# Patient Record
Sex: Female | Born: 1965
Health system: Southern US, Community
[De-identification: ages and names within clinical notes are randomized; demographics above are authoritative.]

## PROBLEM LIST (undated history)

## (undated) DIAGNOSIS — T7840XA Allergy, unspecified, initial encounter: Secondary | ICD-10-CM

## (undated) DIAGNOSIS — H269 Unspecified cataract: Secondary | ICD-10-CM

## (undated) DIAGNOSIS — I10 Essential (primary) hypertension: Secondary | ICD-10-CM

## (undated) DIAGNOSIS — F329 Major depressive disorder, single episode, unspecified: Secondary | ICD-10-CM

## (undated) DIAGNOSIS — F419 Anxiety disorder, unspecified: Secondary | ICD-10-CM

## (undated) DIAGNOSIS — E785 Hyperlipidemia, unspecified: Secondary | ICD-10-CM

## (undated) DIAGNOSIS — F32A Depression, unspecified: Secondary | ICD-10-CM

## (undated) HISTORY — DX: Allergy, unspecified, initial encounter: T78.40XA

## (undated) HISTORY — PX: COLONOSCOPY: SHX174

## (undated) HISTORY — DX: Hyperlipidemia, unspecified: E78.5

## (undated) HISTORY — DX: Essential (primary) hypertension: I10

## (undated) HISTORY — DX: Unspecified cataract: H26.9

## (undated) HISTORY — DX: Anxiety disorder, unspecified: F41.9

## (undated) HISTORY — PX: NASAL SEPTUM SURGERY: SHX37

## (undated) HISTORY — PX: BUNIONECTOMY: SHX129

## (undated) HISTORY — PX: TONSILLECTOMY: SUR1361

## (undated) HISTORY — DX: Depression, unspecified: F32.A

## (undated) HISTORY — PX: BLADDER SUSPENSION: SHX72

## (undated) HISTORY — PX: OTHER SURGICAL HISTORY: SHX169

---
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
## 2005-08-03 ENCOUNTER — Other Ambulatory Visit: Admission: RE | Admit: 2005-08-03 | Discharge: 2005-08-03 | Payer: Self-pay | Admitting: Family Medicine

## 2006-11-03 ENCOUNTER — Other Ambulatory Visit: Admission: RE | Admit: 2006-11-03 | Discharge: 2006-11-03 | Payer: Self-pay | Admitting: Family Medicine

## 2007-08-25 ENCOUNTER — Emergency Department (HOSPITAL_COMMUNITY): Admission: EM | Admit: 2007-08-25 | Discharge: 2007-08-25 | Payer: Self-pay | Admitting: Emergency Medicine

## 2007-12-14 ENCOUNTER — Other Ambulatory Visit: Admission: RE | Admit: 2007-12-14 | Discharge: 2007-12-14 | Payer: Self-pay | Admitting: Family Medicine

## 2008-01-02 ENCOUNTER — Ambulatory Visit (HOSPITAL_COMMUNITY): Admission: RE | Admit: 2008-01-02 | Discharge: 2008-01-02 | Payer: Self-pay | Admitting: Family Medicine

## 2008-12-16 ENCOUNTER — Other Ambulatory Visit: Admission: RE | Admit: 2008-12-16 | Discharge: 2008-12-16 | Payer: Self-pay | Admitting: Family Medicine

## 2009-07-13 ENCOUNTER — Ambulatory Visit (HOSPITAL_COMMUNITY): Admission: RE | Admit: 2009-07-13 | Discharge: 2009-07-13 | Payer: Self-pay | Admitting: Family Medicine

## 2010-03-05 ENCOUNTER — Other Ambulatory Visit
Admission: RE | Admit: 2010-03-05 | Discharge: 2010-03-05 | Payer: Self-pay | Source: Home / Self Care | Admitting: Family Medicine

## 2010-06-01 LAB — BASIC METABOLIC PANEL
BUN: 16 mg/dL (ref 6–23)
Calcium: 9.6 mg/dL (ref 8.4–10.5)
Chloride: 104 mEq/L (ref 96–112)
Glucose, Bld: 88 mg/dL (ref 70–99)
Potassium: 4.3 mEq/L (ref 3.5–5.1)

## 2010-06-01 LAB — CBC
MCV: 93.7 fL (ref 78.0–100.0)
RBC: 4.43 MIL/uL (ref 3.87–5.11)
RDW: 12.8 % (ref 11.5–15.5)
WBC: 6.6 10*3/uL (ref 4.0–10.5)

## 2010-06-01 LAB — APTT: aPTT: 23 seconds — ABNORMAL LOW (ref 24–37)

## 2010-06-03 ENCOUNTER — Ambulatory Visit (HOSPITAL_BASED_OUTPATIENT_CLINIC_OR_DEPARTMENT_OTHER)
Admission: RE | Admit: 2010-06-03 | Discharge: 2010-06-03 | Disposition: A | Discharge status: Home / Self Care | Payer: Commercial Managed Care - PPO | Source: Ambulatory Visit | Attending: Urology | Admitting: Urology

## 2010-06-03 DIAGNOSIS — I10 Essential (primary) hypertension: Secondary | ICD-10-CM | POA: Insufficient documentation

## 2010-06-03 DIAGNOSIS — Z01810 Encounter for preprocedural cardiovascular examination: Secondary | ICD-10-CM | POA: Insufficient documentation

## 2010-06-03 DIAGNOSIS — N393 Stress incontinence (female) (male): Secondary | ICD-10-CM | POA: Insufficient documentation

## 2010-06-03 DIAGNOSIS — Z01812 Encounter for preprocedural laboratory examination: Secondary | ICD-10-CM | POA: Insufficient documentation

## 2010-06-03 LAB — POCT PREGNANCY, URINE: Preg Test, Ur: NEGATIVE

## 2010-07-12 NOTE — Op Note (Signed)
  NAME:  Terry English, Terry English NO.:  [id number]  MEDICAL RECORD NO.:  [id number]           PATIENT TYPE:  LOCATION:                                 FACILITY:  PHYSICIAN:  Martina Sinner, MD      DATE OF BIRTH:  DATE OF PROCEDURE:  06/02/2010 DATE OF DISCHARGE:                              OPERATIVE REPORT   PREOPERATIVE DIAGNOSIS:  Stress incontinence.  POSTOPERATIVE DIAGNOSIS:  Stress incontinence.  SURGERY:  Sling, cystourethropexy [facility]) plus cystoscopy.  INDICATIONS FOR PROCEDURE:  The patient has stress urinary incontinence and consented to the above procedure.  PROCEDURE IN DETAIL:  The patient was prepped and draped in the usual fashion.  Extra care was taken with leg positioning to minimize the risk of compartment syndrome, neuropathy, and DVT.  Preoperative antibiotics were given.  Two 1-cm incisions were made one fingerbreadth above the symphysis pubis 1.5 cm lateral to the midline.  A 2-cm suburethral incision was made after instilling 5 cc of a lidocaine epinephrine mixture.  It was of appropriate depth.  With the bladder empty, I passed a SPARC needle on top of and along the back of the symphysis pubis, staying on the periosteum and staying lateral.  I did my box technique, turning it in onto the pulp of my index finger bilaterally.  I cystoscoped the patient.  There was no injury to the bladder or urethra.  There was excellent blue jets bilaterally.  There was no movement of the bladder with movement of the trocar.  With the bladder empty, I attached a SPARC sling and brought it up to the retropubic space.  I tensioned it over the fat part of a moderate- sized Kelly clamp.  I cut below the blue dots, irrigated the sheath and removed the sheath.  I was very pleased with the tension and position of the sling.  There was no spring-back effect.  After irrigating all incisions, I closed the anterior vaginal wall incision with running 2-0  Vicryl on a CT-1 needle followed by my usual two interrupted sutures.  I cut the sling below the skin in the suprapubic area and closed with 4-0 Vicryl and Dermabond.  Foley catheter draining well at the of the case.  Blood loss was less than 50 cc.  The case went very well and hopefully she will reach her treatment goal.          ______________________________ Martina Sinner, MD     SAM/MEDQ  D:  06/03/2010  T:  06/03/2010  Job:  [id number]  Electronically Signed by Alfredo Martinez MD on 07/12/2010 01:15:24 PM

## 2013-03-04 ENCOUNTER — Other Ambulatory Visit: Payer: Self-pay | Admitting: Family Medicine

## 2013-03-04 ENCOUNTER — Other Ambulatory Visit (HOSPITAL_COMMUNITY)
Admission: RE | Admit: 2013-03-04 | Discharge: 2013-03-04 | Disposition: A | Discharge status: Home / Self Care | Payer: 59 | Source: Ambulatory Visit | Attending: Family Medicine | Admitting: Family Medicine

## 2013-03-04 DIAGNOSIS — Z124 Encounter for screening for malignant neoplasm of cervix: Secondary | ICD-10-CM | POA: Insufficient documentation

## 2013-03-04 DIAGNOSIS — Z1151 Encounter for screening for human papillomavirus (HPV): Secondary | ICD-10-CM | POA: Insufficient documentation

## 2013-03-05 ENCOUNTER — Other Ambulatory Visit: Payer: Self-pay | Admitting: Family Medicine

## 2013-03-05 DIAGNOSIS — N6325 Unspecified lump in the left breast, overlapping quadrants: Secondary | ICD-10-CM

## 2013-03-05 DIAGNOSIS — N6320 Unspecified lump in the left breast, unspecified quadrant: Principal | ICD-10-CM

## 2013-03-11 ENCOUNTER — Ambulatory Visit
Admission: RE | Admit: 2013-03-11 | Discharge: 2013-03-11 | Disposition: A | Discharge status: Home / Self Care | Payer: Commercial Managed Care - PPO | Source: Ambulatory Visit | Attending: Family Medicine | Admitting: Family Medicine

## 2013-03-11 DIAGNOSIS — N6320 Unspecified lump in the left breast, unspecified quadrant: Principal | ICD-10-CM

## 2013-03-11 DIAGNOSIS — N6325 Unspecified lump in the left breast, overlapping quadrants: Secondary | ICD-10-CM

## 2013-12-03 ENCOUNTER — Other Ambulatory Visit: Payer: Self-pay | Admitting: Dermatology

## 2014-08-21 ENCOUNTER — Other Ambulatory Visit (HOSPITAL_COMMUNITY): Payer: Self-pay | Admitting: Family Medicine

## 2014-08-21 DIAGNOSIS — Z1231 Encounter for screening mammogram for malignant neoplasm of breast: Secondary | ICD-10-CM

## 2014-08-27 ENCOUNTER — Ambulatory Visit (HOSPITAL_COMMUNITY)
Admission: RE | Admit: 2014-08-27 | Discharge: 2014-08-27 | Disposition: A | Discharge status: Home / Self Care | Payer: 59 | Source: Ambulatory Visit | Attending: Family Medicine | Admitting: Family Medicine

## 2014-08-27 DIAGNOSIS — Z1231 Encounter for screening mammogram for malignant neoplasm of breast: Secondary | ICD-10-CM | POA: Insufficient documentation

## 2015-03-04 MED FILL — BUTALBITAL/APAP/CAFFEINE TB: 50-325-40 | 2 days supply | Qty: 12 | Fill #3

## 2015-03-19 DIAGNOSIS — Z1212 Encounter for screening for malignant neoplasm of rectum: Secondary | ICD-10-CM | POA: Diagnosis not present

## 2015-03-19 DIAGNOSIS — Z6834 Body mass index (BMI) 34.0-34.9, adult: Secondary | ICD-10-CM | POA: Diagnosis not present

## 2015-03-19 DIAGNOSIS — N920 Excessive and frequent menstruation with regular cycle: Secondary | ICD-10-CM | POA: Diagnosis not present

## 2015-03-19 DIAGNOSIS — Z01419 Encounter for gynecological examination (general) (routine) without abnormal findings: Secondary | ICD-10-CM | POA: Diagnosis not present

## 2015-03-30 DIAGNOSIS — H5213 Myopia, bilateral: Secondary | ICD-10-CM | POA: Diagnosis not present

## 2015-03-30 DIAGNOSIS — H52221 Regular astigmatism, right eye: Secondary | ICD-10-CM | POA: Diagnosis not present

## 2015-03-31 DIAGNOSIS — N920 Excessive and frequent menstruation with regular cycle: Secondary | ICD-10-CM | POA: Diagnosis not present

## 2015-03-31 DIAGNOSIS — N924 Excessive bleeding in the premenopausal period: Secondary | ICD-10-CM | POA: Diagnosis not present

## 2015-04-09 MED FILL — ROSUVASTATIN CALCIUM 10 MG: 10 | 90 days supply | Qty: 90 | Fill #2

## 2015-05-15 MED FILL — LARIN 21 1-20 TABLET: 1-20 | 84 days supply | Qty: 84 | Fill #0

## 2015-05-15 MED FILL — LISINOPRIL-HCTZ 20-12.5 MG: 20-12.5 | 90 days supply | Qty: 90 | Fill #0

## 2015-05-18 DIAGNOSIS — F329 Major depressive disorder, single episode, unspecified: Secondary | ICD-10-CM | POA: Diagnosis not present

## 2015-05-18 DIAGNOSIS — I10 Essential (primary) hypertension: Secondary | ICD-10-CM | POA: Diagnosis not present

## 2015-05-18 DIAGNOSIS — R739 Hyperglycemia, unspecified: Secondary | ICD-10-CM | POA: Diagnosis not present

## 2015-05-18 DIAGNOSIS — Z0000 Encounter for general adult medical examination without abnormal findings: Secondary | ICD-10-CM | POA: Diagnosis not present

## 2015-05-18 DIAGNOSIS — Z23 Encounter for immunization: Secondary | ICD-10-CM | POA: Diagnosis not present

## 2015-05-18 DIAGNOSIS — E785 Hyperlipidemia, unspecified: Secondary | ICD-10-CM | POA: Diagnosis not present

## 2015-05-18 MED FILL — FLUoxetine HCL 20 MG CAPS: 20 | 90 days supply | Qty: 90 | Fill #0

## 2015-05-18 MED FILL — BUTALBITAL/APAP/CAFFEINE TB: 50-325-40 | 7 days supply | Qty: 20 | Fill #0

## 2015-07-13 MED FILL — ROSUVASTATIN CALCIUM 10 MG: 10 | 90 days supply | Qty: 90 | Fill #0

## 2015-08-03 DIAGNOSIS — N924 Excessive bleeding in the premenopausal period: Secondary | ICD-10-CM | POA: Diagnosis not present

## 2015-08-03 DIAGNOSIS — D259 Leiomyoma of uterus, unspecified: Secondary | ICD-10-CM | POA: Diagnosis not present

## 2015-08-12 MED FILL — LISINOPRIL-HCTZ 20-12.5 MG: 20-12.5 | 90 days supply | Qty: 90 | Fill #1

## 2015-08-12 MED FILL — LARIN 21 1-20 TABLET: 1-20 | 84 days supply | Qty: 84 | Fill #1

## 2015-10-13 MED FILL — ROSUVASTATIN CALCIUM 10 MG: 10 | 90 days supply | Qty: 90 | Fill #1

## 2015-11-11 MED FILL — BUTALBITAL/APAP/CAFFEINE TB: 50-325-40 | 7 days supply | Qty: 20 | Fill #1

## 2015-11-18 MED FILL — LISINOPRIL-HCTZ 20-12.5 MG: 20-12.5 | 90 days supply | Qty: 90 | Fill #2

## 2015-11-18 MED FILL — LARIN 21 1-20 TABLET: 1-20 | 84 days supply | Qty: 84 | Fill #2

## 2015-12-24 MED FILL — FLUoxetine HCL 20 MG CAPS: 20 | 90 days supply | Qty: 90 | Fill #1

## 2016-01-18 MED FILL — ROSUVASTATIN CALCIUM 10 MG: 10 | 90 days supply | Qty: 90 | Fill #2

## 2016-02-05 MED FILL — BUTALBITAL/APAP/CAFFEINE TB: 50-325-40 | 7 days supply | Qty: 20 | Fill #2

## 2016-02-15 MED FILL — LISINOPRIL-HCTZ 20-12.5 MG: 20-12.5 | 90 days supply | Qty: 90 | Fill #3

## 2016-02-15 MED FILL — LARIN 21 1-20 TABLET: 1-20 | 84 days supply | Qty: 84 | Fill #3

## 2016-03-11 DIAGNOSIS — Z23 Encounter for immunization: Secondary | ICD-10-CM | POA: Diagnosis not present

## 2016-03-23 DIAGNOSIS — H5213 Myopia, bilateral: Secondary | ICD-10-CM | POA: Diagnosis not present

## 2016-03-23 DIAGNOSIS — H52221 Regular astigmatism, right eye: Secondary | ICD-10-CM | POA: Diagnosis not present

## 2016-04-18 MED FILL — ROSUVASTATIN CALCIUM 10 MG: 10 | 90 days supply | Qty: 90 | Fill #3

## 2016-05-23 MED FILL — LISINOPRIL-HCTZ 20-12.5 MG: 20-12.5 | 90 days supply | Qty: 90 | Fill #0

## 2016-05-23 MED FILL — LARIN 21 1-20 TABLET: 1-20 | 84 days supply | Qty: 84 | Fill #0

## 2016-06-01 DIAGNOSIS — I10 Essential (primary) hypertension: Secondary | ICD-10-CM | POA: Diagnosis not present

## 2016-06-01 DIAGNOSIS — Z0000 Encounter for general adult medical examination without abnormal findings: Secondary | ICD-10-CM | POA: Diagnosis not present

## 2016-06-01 DIAGNOSIS — E785 Hyperlipidemia, unspecified: Secondary | ICD-10-CM | POA: Diagnosis not present

## 2016-06-01 DIAGNOSIS — R7303 Prediabetes: Secondary | ICD-10-CM | POA: Diagnosis not present

## 2016-06-01 DIAGNOSIS — F330 Major depressive disorder, recurrent, mild: Secondary | ICD-10-CM | POA: Diagnosis not present

## 2016-06-01 MED FILL — ESCITALOPRAM 10 MG TABLET: 10 | 90 days supply | Qty: 90 | Fill #0

## 2016-06-01 MED FILL — BUTALBITAL/APAP/CAFFEINE TB: 50-325-40 | 3 days supply | Qty: 20 | Fill #0

## 2016-07-18 MED FILL — ROSUVASTATIN CALCIUM 10 MG: 10 | 90 days supply | Qty: 90 | Fill #0

## 2016-08-23 MED FILL — LISINOPRIL-HCTZ 20-12.5 MG: 20-12.5 | 90 days supply | Qty: 90 | Fill #1

## 2016-08-24 MED FILL — LARIN 21 1-20 TABLET: 1-20 | 84 days supply | Qty: 84 | Fill #1

## 2016-09-02 MED FILL — ESCITALOPRAM 10 MG TABLET: 10 | 90 days supply | Qty: 90 | Fill #1

## 2016-10-14 MED FILL — ROSUVASTATIN CALCIUM 10 MG: 10 | 90 days supply | Qty: 90 | Fill #1

## 2016-11-23 MED FILL — LISINOPRIL-HCTZ 20-12.5 MG: 20-12.5 | 90 days supply | Qty: 90 | Fill #2

## 2016-11-23 MED FILL — LARIN 21 1-20 TABLET: 1-20 | 84 days supply | Qty: 84 | Fill #2

## 2016-11-23 MED FILL — ESCITALOPRAM 10 MG TABLET: 10 | 90 days supply | Qty: 90 | Fill #2

## 2016-12-28 DIAGNOSIS — E559 Vitamin D deficiency, unspecified: Secondary | ICD-10-CM | POA: Diagnosis not present

## 2016-12-28 DIAGNOSIS — R5382 Chronic fatigue, unspecified: Secondary | ICD-10-CM | POA: Diagnosis not present

## 2016-12-28 DIAGNOSIS — Z23 Encounter for immunization: Secondary | ICD-10-CM | POA: Diagnosis not present

## 2016-12-28 DIAGNOSIS — E611 Iron deficiency: Secondary | ICD-10-CM | POA: Diagnosis not present

## 2016-12-28 DIAGNOSIS — E538 Deficiency of other specified B group vitamins: Secondary | ICD-10-CM | POA: Diagnosis not present

## 2016-12-28 DIAGNOSIS — R05 Cough: Secondary | ICD-10-CM | POA: Diagnosis not present

## 2016-12-28 MED FILL — BENZONATATE 200 MG CAPSULE: 200 | 10 days supply | Qty: 30 | Fill #0

## 2016-12-28 MED FILL — AZITHROMYCIN 250 MG TABLET: 250 | 5 days supply | Qty: 6 | Fill #0

## 2017-01-03 ENCOUNTER — Other Ambulatory Visit: Payer: Self-pay | Admitting: Family Medicine

## 2017-01-03 DIAGNOSIS — R7989 Other specified abnormal findings of blood chemistry: Secondary | ICD-10-CM

## 2017-01-03 DIAGNOSIS — R945 Abnormal results of liver function studies: Principal | ICD-10-CM

## 2017-01-09 ENCOUNTER — Other Ambulatory Visit: Payer: 59

## 2017-01-09 MED FILL — ROSUVASTATIN CALCIUM 10 MG: 10 | 90 days supply | Qty: 90 | Fill #2

## 2017-01-13 ENCOUNTER — Ambulatory Visit
Admission: RE | Admit: 2017-01-13 | Discharge: 2017-01-13 | Disposition: A | Discharge status: Home / Self Care | Payer: 59 | Source: Ambulatory Visit | Attending: Family Medicine | Admitting: Family Medicine

## 2017-01-13 DIAGNOSIS — R7989 Other specified abnormal findings of blood chemistry: Secondary | ICD-10-CM

## 2017-01-13 DIAGNOSIS — R945 Abnormal results of liver function studies: Principal | ICD-10-CM

## 2017-01-25 ENCOUNTER — Other Ambulatory Visit: Payer: Self-pay | Admitting: Obstetrics and Gynecology

## 2017-01-25 DIAGNOSIS — Z1231 Encounter for screening mammogram for malignant neoplasm of breast: Secondary | ICD-10-CM

## 2017-02-02 DIAGNOSIS — Z01419 Encounter for gynecological examination (general) (routine) without abnormal findings: Secondary | ICD-10-CM | POA: Diagnosis not present

## 2017-02-02 DIAGNOSIS — Z1212 Encounter for screening for malignant neoplasm of rectum: Secondary | ICD-10-CM | POA: Diagnosis not present

## 2017-02-02 DIAGNOSIS — Z6834 Body mass index (BMI) 34.0-34.9, adult: Secondary | ICD-10-CM | POA: Diagnosis not present

## 2017-02-17 MED FILL — LISINOPRIL-HCTZ 20-12.5 MG: 20-12.5 | 90 days supply | Qty: 90 | Fill #3

## 2017-02-17 MED FILL — BUTALB-ACETAMIN-CAFF 50-325: 50-325-40 | 3 days supply | Qty: 20 | Fill #1

## 2017-02-17 MED FILL — ESCITALOPRAM 10 MG TABLET: 10 | 90 days supply | Qty: 90 | Fill #3

## 2017-02-17 MED FILL — LARIN 21 1-20 TABLET: 1-20 | 84 days supply | Qty: 84 | Fill #3

## 2017-02-22 ENCOUNTER — Ambulatory Visit
Admission: RE | Admit: 2017-02-22 | Discharge: 2017-02-22 | Disposition: A | Discharge status: Home / Self Care | Payer: 59 | Source: Ambulatory Visit | Attending: Obstetrics and Gynecology | Admitting: Obstetrics and Gynecology

## 2017-02-22 DIAGNOSIS — Z1231 Encounter for screening mammogram for malignant neoplasm of breast: Secondary | ICD-10-CM | POA: Diagnosis not present

## 2017-02-23 ENCOUNTER — Other Ambulatory Visit: Payer: Self-pay | Admitting: Obstetrics and Gynecology

## 2017-02-23 DIAGNOSIS — R928 Other abnormal and inconclusive findings on diagnostic imaging of breast: Secondary | ICD-10-CM

## 2017-03-01 ENCOUNTER — Ambulatory Visit
Admission: RE | Admit: 2017-03-01 | Discharge: 2017-03-01 | Disposition: A | Discharge status: Home / Self Care | Payer: 59 | Source: Ambulatory Visit | Attending: Obstetrics and Gynecology | Admitting: Obstetrics and Gynecology

## 2017-03-01 ENCOUNTER — Ambulatory Visit: Admission: RE | Admit: 2017-03-01 | Payer: 59 | Source: Ambulatory Visit

## 2017-03-01 DIAGNOSIS — R928 Other abnormal and inconclusive findings on diagnostic imaging of breast: Secondary | ICD-10-CM

## 2017-03-01 DIAGNOSIS — N6011 Diffuse cystic mastopathy of right breast: Secondary | ICD-10-CM | POA: Diagnosis not present

## 2017-03-01 DIAGNOSIS — R922 Inconclusive mammogram: Secondary | ICD-10-CM | POA: Diagnosis not present

## 2017-03-31 DIAGNOSIS — H524 Presbyopia: Secondary | ICD-10-CM | POA: Diagnosis not present

## 2017-03-31 DIAGNOSIS — H52221 Regular astigmatism, right eye: Secondary | ICD-10-CM | POA: Diagnosis not present

## 2017-03-31 DIAGNOSIS — H5213 Myopia, bilateral: Secondary | ICD-10-CM | POA: Diagnosis not present

## 2017-05-23 MED FILL — ROSUVASTATIN CALCIUM 10 MG: 10 | 90 days supply | Qty: 90 | Fill #3

## 2017-05-23 MED FILL — LARIN 21 1-20 TABLET: 1-20 | 84 days supply | Qty: 84 | Fill #0

## 2017-05-23 MED FILL — BUTALB-ACETAMIN-CAFF 50-325: 50-325-40 | 3 days supply | Qty: 20 | Fill #2

## 2017-06-02 DIAGNOSIS — Z0000 Encounter for general adult medical examination without abnormal findings: Secondary | ICD-10-CM | POA: Diagnosis not present

## 2017-06-02 DIAGNOSIS — I10 Essential (primary) hypertension: Secondary | ICD-10-CM | POA: Diagnosis not present

## 2017-06-02 DIAGNOSIS — E785 Hyperlipidemia, unspecified: Secondary | ICD-10-CM | POA: Diagnosis not present

## 2017-06-02 DIAGNOSIS — F330 Major depressive disorder, recurrent, mild: Secondary | ICD-10-CM | POA: Diagnosis not present

## 2017-06-02 DIAGNOSIS — E559 Vitamin D deficiency, unspecified: Secondary | ICD-10-CM | POA: Diagnosis not present

## 2017-06-02 MED FILL — ONDANSETRON ODT 8 MG TABLET: 8 | 5 days supply | Qty: 15 | Fill #0

## 2017-06-02 MED FILL — ESCITALOPRAM 10 MG TABLET: 10 | 90 days supply | Qty: 90 | Fill #0

## 2017-06-02 MED FILL — LISINOPRIL-HCTZ 20-12.5 MG: 20-12.5 | 90 days supply | Qty: 90 | Fill #0

## 2017-06-02 MED FILL — VIT D2 1.25 MG (50,000 UNIT: 1.25 MG | 56 days supply | Qty: 8 | Fill #0

## 2017-08-11 MED FILL — BUTALB-ACETAMIN-CAFF 50-325: 50-325-40 | 30 days supply | Qty: 20 | Fill #0

## 2017-08-22 MED FILL — ROSUVASTATIN CALCIUM 10 MG: 10 | 90 days supply | Qty: 90 | Fill #0

## 2017-08-22 MED FILL — LISINOPRIL-HCTZ 20-12.5 MG: 20-12.5 | 90 days supply | Qty: 90 | Fill #1

## 2017-08-24 MED FILL — LARIN 21 1-20 TABLET: 1-20 | 84 days supply | Qty: 84 | Fill #1

## 2017-09-07 MED FILL — ESCITALOPRAM 10 MG TABLET: 10 | 90 days supply | Qty: 90 | Fill #1

## 2017-10-23 MED FILL — BUTALB-ACETAMIN-CAFF 50-325: 50-325-40 | 30 days supply | Qty: 20 | Fill #1

## 2017-11-20 MED FILL — ROSUVASTATIN CALCIUM 10 MG: 10 | 90 days supply | Qty: 90 | Fill #1

## 2017-11-20 MED FILL — LARIN 21 1-20 TABLET: 1-20 | 84 days supply | Qty: 84 | Fill #2

## 2017-11-29 MED FILL — ESCITALOPRAM 10 MG TABLET: 10 | 90 days supply | Qty: 90 | Fill #2

## 2017-11-29 MED FILL — LISINOPRIL-HCTZ 20-12.5 MG: 20-12.5 | 90 days supply | Qty: 90 | Fill #2

## 2017-12-01 DIAGNOSIS — Z23 Encounter for immunization: Secondary | ICD-10-CM | POA: Diagnosis not present

## 2017-12-27 DIAGNOSIS — Z23 Encounter for immunization: Secondary | ICD-10-CM | POA: Diagnosis not present

## 2017-12-27 DIAGNOSIS — L821 Other seborrheic keratosis: Secondary | ICD-10-CM | POA: Diagnosis not present

## 2017-12-27 DIAGNOSIS — L820 Inflamed seborrheic keratosis: Secondary | ICD-10-CM | POA: Diagnosis not present

## 2017-12-27 DIAGNOSIS — L309 Dermatitis, unspecified: Secondary | ICD-10-CM | POA: Diagnosis not present

## 2017-12-27 MED FILL — TRIAMCINOLONE 0.1% CREAM: 0.1 | 30 days supply | Qty: 60 | Fill #0

## 2018-02-05 MED FILL — BUTALB-ACETAMIN-CAFF 50-325: 50-325-40 | 30 days supply | Qty: 20 | Fill #2

## 2018-02-16 MED FILL — LARIN 21 1-20 TABLET: 1-20 | 84 days supply | Qty: 84 | Fill #0

## 2018-02-20 MED FILL — LISINOPRIL-HCTZ 20-12.5 MG: 20-12.5 | 90 days supply | Qty: 90 | Fill #3

## 2018-02-20 MED FILL — ROSUVASTATIN CALCIUM 10 MG: 10 | 90 days supply | Qty: 90 | Fill #2

## 2018-03-05 MED FILL — ESCITALOPRAM 10 MG TABLET: 10 | 90 days supply | Qty: 90 | Fill #3

## 2018-03-16 DIAGNOSIS — Z6833 Body mass index (BMI) 33.0-33.9, adult: Secondary | ICD-10-CM | POA: Diagnosis not present

## 2018-03-16 DIAGNOSIS — N959 Unspecified menopausal and perimenopausal disorder: Secondary | ICD-10-CM | POA: Diagnosis not present

## 2018-03-16 DIAGNOSIS — Z1212 Encounter for screening for malignant neoplasm of rectum: Secondary | ICD-10-CM | POA: Diagnosis not present

## 2018-03-16 DIAGNOSIS — Z01419 Encounter for gynecological examination (general) (routine) without abnormal findings: Secondary | ICD-10-CM | POA: Diagnosis not present

## 2018-04-02 DIAGNOSIS — N951 Menopausal and female climacteric states: Secondary | ICD-10-CM | POA: Diagnosis not present

## 2018-04-06 DIAGNOSIS — H5711 Ocular pain, right eye: Secondary | ICD-10-CM | POA: Diagnosis not present

## 2018-04-16 DIAGNOSIS — L814 Other melanin hyperpigmentation: Secondary | ICD-10-CM | POA: Diagnosis not present

## 2018-04-16 DIAGNOSIS — D225 Melanocytic nevi of trunk: Secondary | ICD-10-CM | POA: Diagnosis not present

## 2018-04-16 DIAGNOSIS — Z808 Family history of malignant neoplasm of other organs or systems: Secondary | ICD-10-CM | POA: Diagnosis not present

## 2018-04-16 DIAGNOSIS — Z23 Encounter for immunization: Secondary | ICD-10-CM | POA: Diagnosis not present

## 2018-04-16 DIAGNOSIS — D1801 Hemangioma of skin and subcutaneous tissue: Secondary | ICD-10-CM | POA: Diagnosis not present

## 2018-04-16 DIAGNOSIS — L821 Other seborrheic keratosis: Secondary | ICD-10-CM | POA: Diagnosis not present

## 2018-05-15 MED FILL — LARIN 21 1-20 TABLET: 1-20 | 84 days supply | Qty: 84 | Fill #1

## 2018-05-15 MED FILL — LISINOPRIL-HCTZ 20-12.5 MG: 20-12.5 | 90 days supply | Qty: 90 | Fill #0

## 2018-05-15 MED FILL — ROSUVASTATIN CALCIUM 10 MG: 10 | 90 days supply | Qty: 90 | Fill #0

## 2018-05-18 ENCOUNTER — Encounter: Payer: Self-pay | Admitting: Gastroenterology

## 2018-05-31 MED FILL — ESCITALOPRAM 10 MG TABLET: 10 | 90 days supply | Qty: 90 | Fill #0

## 2018-07-04 DIAGNOSIS — F330 Major depressive disorder, recurrent, mild: Secondary | ICD-10-CM | POA: Diagnosis not present

## 2018-07-04 DIAGNOSIS — G43909 Migraine, unspecified, not intractable, without status migrainosus: Secondary | ICD-10-CM | POA: Diagnosis not present

## 2018-07-04 DIAGNOSIS — I10 Essential (primary) hypertension: Secondary | ICD-10-CM | POA: Diagnosis not present

## 2018-07-04 DIAGNOSIS — K589 Irritable bowel syndrome without diarrhea: Secondary | ICD-10-CM | POA: Diagnosis not present

## 2018-07-04 DIAGNOSIS — R7303 Prediabetes: Secondary | ICD-10-CM | POA: Diagnosis not present

## 2018-07-04 DIAGNOSIS — F329 Major depressive disorder, single episode, unspecified: Secondary | ICD-10-CM | POA: Diagnosis not present

## 2018-07-04 DIAGNOSIS — E785 Hyperlipidemia, unspecified: Secondary | ICD-10-CM | POA: Diagnosis not present

## 2018-07-05 DIAGNOSIS — E785 Hyperlipidemia, unspecified: Secondary | ICD-10-CM | POA: Diagnosis not present

## 2018-07-05 DIAGNOSIS — E559 Vitamin D deficiency, unspecified: Secondary | ICD-10-CM | POA: Diagnosis not present

## 2018-07-05 DIAGNOSIS — R7303 Prediabetes: Secondary | ICD-10-CM | POA: Diagnosis not present

## 2018-07-09 DIAGNOSIS — H52221 Regular astigmatism, right eye: Secondary | ICD-10-CM | POA: Diagnosis not present

## 2018-07-09 DIAGNOSIS — H524 Presbyopia: Secondary | ICD-10-CM | POA: Diagnosis not present

## 2018-07-09 DIAGNOSIS — H5213 Myopia, bilateral: Secondary | ICD-10-CM | POA: Diagnosis not present

## 2018-07-10 MED FILL — VIT D2 1.25 MG (50,000 UNIT: 1.25 MG | 56 days supply | Qty: 8 | Fill #0

## 2018-08-20 MED FILL — ESCITALOPRAM 10 MG TABLET: 10 | 90 days supply | Qty: 90 | Fill #0

## 2018-08-20 MED FILL — LISINOPRIL-HCTZ 20-12.5 MG: 20-12.5 | 90 days supply | Qty: 90 | Fill #0

## 2018-08-20 MED FILL — NORETHIND-ETH ESTRAD 1-0.02: 1-20 | 63 days supply | Qty: 84 | Fill #0

## 2018-08-20 MED FILL — ROSUVASTATIN CALCIUM 10 MG: 10 | 90 days supply | Qty: 90 | Fill #0

## 2018-09-18 ENCOUNTER — Encounter: Payer: Self-pay | Admitting: Gastroenterology

## 2018-09-21 MED FILL — SHINGRIX 50 MCG SUS: 50 | 1 days supply | Qty: 1 | Fill #0

## 2018-09-25 ENCOUNTER — Ambulatory Visit: Payer: 59 | Admitting: *Deleted

## 2018-09-25 ENCOUNTER — Other Ambulatory Visit: Payer: Self-pay

## 2018-09-25 VITALS — Ht 66.0 in | Wt 205.0 lb

## 2018-09-25 DIAGNOSIS — Z1211 Encounter for screening for malignant neoplasm of colon: Secondary | ICD-10-CM

## 2018-09-25 MED ORDER — SUPREP BOWEL PREP KIT 17.5-3.13-1.6 GM/177ML PO SOLN: 1.0000 | Freq: Once | ORAL | 0 refills | Status: AC

## 2018-09-25 MED FILL — SUPREP BOWEL PREP KIT: 17.5-3.13-1 | 1 days supply | Qty: 354 | Fill #0

## 2018-09-25 NOTE — Progress Notes (Signed)
No egg or soy allergy known to patient  No issues with past sedation with any surgeries  or procedures, no intubation problems  No diet pills per patient No home 02 use per patient  No blood thinners per patient  Pt denies issues with constipation  No A fib or A flutter  EMMI video sent to pt's e mail   Pt verified name, DOB, address and insurance during PV today. Pt mailed instruction packet to included paper to complete and mail back to [facility] with addressed and stamped envelope, Emmi video, copy of consent form to read and not return, and instructions.PV completed over the phone. Pt encouraged to call with questions or issues   Pt is aware that care partner will wait in the car during procedure; if they feel like they will be too hot to wait in the car; they may wait in the lobby.  We want them to wear a mask (we do not have any that we can provide them), practice social distancing, and we will check their temperatures when they get here.  I did remind patient that their care partner needs to stay in the parking lot the entire time. Pt will wear mask into building.  

## 2018-09-26 DIAGNOSIS — Z1231 Encounter for screening mammogram for malignant neoplasm of breast: Secondary | ICD-10-CM | POA: Diagnosis not present

## 2018-10-08 ENCOUNTER — Telehealth: Payer: Self-pay | Admitting: Gastroenterology

## 2018-10-08 NOTE — Telephone Encounter (Signed)
No answer and mailbox is full, unable to leave message regarding Covid-19 screening questions. Covid-19 Screening Questions:  Do you now or have you had a fever in the last 14 days?   Do you have any respiratory symptoms of shortness of breath or cough now or in the last 14 days?   Do you have any family members or close contacts with diagnosed or suspected Covid-19 in the past 14 days?   Have you been tested for Covid-19 and found to be positive?

## 2018-10-09 ENCOUNTER — Encounter: Payer: Self-pay | Admitting: Gastroenterology

## 2018-10-09 ENCOUNTER — Other Ambulatory Visit: Payer: Self-pay

## 2018-10-09 ENCOUNTER — Ambulatory Visit (AMBULATORY_SURGERY_CENTER): Payer: 59 | Admitting: Gastroenterology

## 2018-10-09 VITALS — BP 113/79 | HR 87 | Temp 98.4°F | Resp 16 | Ht 66.0 in | Wt 205.0 lb

## 2018-10-09 DIAGNOSIS — D122 Benign neoplasm of ascending colon: Secondary | ICD-10-CM | POA: Diagnosis not present

## 2018-10-09 DIAGNOSIS — I10 Essential (primary) hypertension: Secondary | ICD-10-CM | POA: Diagnosis not present

## 2018-10-09 DIAGNOSIS — E785 Hyperlipidemia, unspecified: Secondary | ICD-10-CM | POA: Diagnosis not present

## 2018-10-09 DIAGNOSIS — K635 Polyp of colon: Secondary | ICD-10-CM

## 2018-10-09 DIAGNOSIS — D120 Benign neoplasm of cecum: Secondary | ICD-10-CM

## 2018-10-09 DIAGNOSIS — D124 Benign neoplasm of descending colon: Secondary | ICD-10-CM

## 2018-10-09 DIAGNOSIS — Z1211 Encounter for screening for malignant neoplasm of colon: Secondary | ICD-10-CM

## 2018-10-09 MED ORDER — SODIUM CHLORIDE 0.9 % IV SOLN: 500.0000 mL | Freq: Once | INTRAVENOUS | Status: DC

## 2018-10-09 NOTE — Progress Notes (Signed)
Report to PACU, RN, vss, BBS= Clear.  

## 2018-10-09 NOTE — Progress Notes (Signed)
Pt's states no medical or surgical changes since previsit or office visit.   CW vitals and [place] temps. Sm

## 2018-10-09 NOTE — Progress Notes (Signed)
Called to room to assist during endoscopic procedure.  Patient ID and intended procedure confirmed with present staff. Received instructions for my participation in the procedure from the performing physician.  

## 2018-10-09 NOTE — Patient Instructions (Signed)
Please read handouts provided. Await pathology results. Continue present medications.      YOU HAD AN ENDOSCOPIC PROCEDURE TODAY AT THE [facility]:   Refer to the procedure report that was given to you for any specific questions about what was found during the examination.  If the procedure report does not answer your questions, please call your gastroenterologist to clarify.  If you requested that your care partner not be given the details of your procedure findings, then the procedure report has been included in a sealed envelope for you to review at your convenience later.  YOU SHOULD EXPECT: Some feelings of bloating in the abdomen. Passage of more gas than usual.  Walking can help get rid of the air that was put into your GI tract during the procedure and reduce the bloating. If you had a lower endoscopy (such as a colonoscopy or flexible sigmoidoscopy) you may notice spotting of blood in your stool or on the toilet paper. If you underwent a bowel prep for your procedure, you may not have a normal bowel movement for a few days.  Please Note:  You might notice some irritation and congestion in your nose or some drainage.  This is from the oxygen used during your procedure.  There is no need for concern and it should clear up in a day or so.  SYMPTOMS TO REPORT IMMEDIATELY:   Following lower endoscopy (colonoscopy or flexible sigmoidoscopy):  Excessive amounts of blood in the stool  Significant tenderness or worsening of abdominal pains  Swelling of the abdomen that is new, acute  Fever of 100F or higher    For urgent or emergent issues, a gastroenterologist can be reached at any hour by calling [phone].   DIET:  We do recommend a small meal at first, but then you may proceed to your regular diet.  Drink plenty of fluids but you should avoid alcoholic beverages for 24 hours.  ACTIVITY:  You should plan to take it easy for the rest of today and you should NOT  DRIVE or use heavy machinery until tomorrow (because of the sedation medicines used during the test).    FOLLOW UP: Our staff will call the number listed on your records 48-72 hours following your procedure to check on you and address any questions or concerns that you may have regarding the information given to you following your procedure. If we do not reach you, we will leave a message.  We will attempt to reach you two times.  During this call, we will ask if you have developed any symptoms of COVID 19. If you develop any symptoms (ie: fever, flu-like symptoms, shortness of breath, cough etc.) before then, please call [phone].  If you test positive for Covid 19 in the 2 weeks post procedure, please call and report this information to us.    If any biopsies were taken you will be contacted by phone or by letter within the next 1-3 weeks.  Please call us at [phone] if you have not heard about the biopsies in 3 weeks.    SIGNATURES/CONFIDENTIALITY: You and/or your care partner have signed paperwork which will be entered into your electronic medical record.  These signatures attest to the fact that that the information above on your After Visit Summary has been reviewed and is understood.  Full responsibility of the confidentiality of this discharge information lies with you and/or your care-partner. 

## 2018-10-09 NOTE — Op Note (Signed)
[place] [facility] Patient Name: Terry English Procedure Date: 10/09/2018 10:07 AM MRN: [id number] Endoscopist: Ladene Artist , MD Age: 53 Referring MD:  Date of Birth: 1965-06-04 Gender: Female Account #: [id number] Procedure:                Colonoscopy Indications:              Screening for colorectal malignant neoplasm Medicines:                Monitored Anesthesia Care Procedure:                Pre-Anesthesia Assessment:                           - Prior to the procedure, a History and Physical                            was performed, and patient medications and                            allergies were reviewed. The patient's tolerance of                            previous anesthesia was also reviewed. The risks                            and benefits of the procedure and the sedation                            options and risks were discussed with the patient.                            All questions were answered, and informed consent                            was obtained. Prior Anticoagulants: The patient has                            taken no previous anticoagulant or antiplatelet                            agents. ASA Grade Assessment: II - A patient with                            mild systemic disease. After reviewing the risks                            and benefits, the patient was deemed in                            satisfactory condition to undergo the procedure.                           After obtaining informed consent, the colonoscope  was passed under direct vision. Throughout the                            procedure, the patient's blood pressure, pulse, and                            oxygen saturations were monitored continuously. The                            Colonoscope was introduced through the anus and                            advanced to the the cecum, identified by                            appendiceal orifice and  ileocecal valve. The                            ileocecal valve, appendiceal orifice, and rectum                            were photographed. The quality of the bowel                            preparation was good after extensive lavage and                            suctioning. The colonoscopy was performed without                            difficulty. The patient tolerated the procedure                            well. Scope In: 10:23:36 AM Scope Out: 10:42:02 AM Scope Withdrawal Time: 0 hours 15 minutes 31 seconds  Total Procedure Duration: 0 hours 18 minutes 26 seconds  Findings:                 The perianal and digital rectal examinations were                            normal.                           A 7 mm polyp was found in the cecum. The polyp was                            sessile. The polyp was removed with a cold snare.                            Resection and retrieval were complete.                           A 12 mm polyp was found in the ascending colon. The  polyp was semi-pedunculated. The polyp was removed                            with a cold snare. Resection and retrieval were                            complete.                           A 4 mm polyp was found in the descending colon. The                            polyp was sessile. The polyp was removed with a                            cold biopsy forceps. Resection and retrieval were                            complete.                           The exam was otherwise without abnormality on                            direct and retroflexion views. Complications:            No immediate complications. Estimated blood loss:                            None. Estimated Blood Loss:     Estimated blood loss: none. Impression:               - One 7 mm polyp in the cecum, removed with a cold                            snare. Resected and retrieved.                           - One 12 mm  polyp in the ascending colon, removed                            with a cold snare. Resected and retrieved.                           - One 4 mm polyp in the descending colon, removed                            with a cold biopsy forceps. Resected and retrieved.                           - The examination was otherwise normal on direct                            and retroflexion views. Recommendation:           -  Repeat colonoscopy date to be determined after                            pending pathology results are reviewed.                           - Patient has a contact number available for                            emergencies. The signs and symptoms of potential                            delayed complications were discussed with the                            patient. Return to normal activities tomorrow.                            Written discharge instructions were provided to the                            patient.                           - Resume previous diet.                           - Continue present medications.                           - Await pathology results. Ladene Artist, MD 10/09/2018 10:47:08 AM This report has been signed electronically.

## 2018-10-11 ENCOUNTER — Telehealth: Payer: Self-pay

## 2018-10-11 NOTE — Telephone Encounter (Signed)
  Follow up Call-  Call back number 10/09/2018  Post procedure Call Back phone  # [phone]  Permission to leave phone message Yes  Some recent data might be hidden     Patient questions:  Do you have a fever, pain , or abdominal swelling? No. Pain Score  0 *  Have you tolerated food without any problems? Yes.    Have you been able to return to your normal activities? Yes.    Do you have any questions about your discharge instructions: Diet   No. Medications  No. Follow up visit  No.  Do you have questions or concerns about your Care? No.  Actions: * If pain score is 4 or above: No action needed, pain <4.  1. Have you developed a fever since your procedure? no  2.   Have you had an respiratory symptoms (SOB or cough) since your procedure? no  3.   Have you tested positive for COVID 19 since your procedure no  4.   Have you had any family members/close contacts diagnosed with the COVID 19 since your procedure?  no   If yes to any of these questions please route to Joylene John, RN and Alphonsa Gin, Therapist, sports.

## 2018-10-18 ENCOUNTER — Encounter: Payer: Self-pay | Admitting: Gastroenterology

## 2018-11-16 MED FILL — LISINOPRIL-HCTZ 20-12.5 MG: 20-12.5 | 90 days supply | Qty: 90 | Fill #0

## 2018-11-16 MED FILL — ROSUVASTATIN CALCIUM 10 MG: 10 | 90 days supply | Qty: 90 | Fill #0

## 2018-11-16 MED FILL — ESCITALOPRAM 10 MG TABLET: 10 | 90 days supply | Qty: 90 | Fill #0

## 2018-11-29 MED FILL — SHINGRIX 50 MCG SUS: 50 | 1 days supply | Qty: 1 | Fill #1

## 2018-12-12 MED FILL — SHINGRIX 50 MCG SUS: 50 | 1 days supply | Qty: 1 | Fill #1

## 2019-02-19 MED FILL — LISINOPRIL-HCTZ 20-12.5 MG: 20-12.5 | 90 days supply | Qty: 90 | Fill #0

## 2019-02-19 MED FILL — ESCITALOPRAM 10 MG TABLET: 10 | 90 days supply | Qty: 90 | Fill #1

## 2019-02-20 MED FILL — ROSUVASTATIN CALCIUM 10 MG: 10 | 90 days supply | Qty: 90 | Fill #0

## 2019-03-20 DIAGNOSIS — E785 Hyperlipidemia, unspecified: Secondary | ICD-10-CM | POA: Diagnosis not present

## 2019-03-20 DIAGNOSIS — R5382 Chronic fatigue, unspecified: Secondary | ICD-10-CM | POA: Diagnosis not present

## 2019-03-20 DIAGNOSIS — E559 Vitamin D deficiency, unspecified: Secondary | ICD-10-CM | POA: Diagnosis not present

## 2019-03-20 DIAGNOSIS — E611 Iron deficiency: Secondary | ICD-10-CM | POA: Diagnosis not present

## 2019-03-20 DIAGNOSIS — E538 Deficiency of other specified B group vitamins: Secondary | ICD-10-CM | POA: Diagnosis not present

## 2019-03-20 DIAGNOSIS — F330 Major depressive disorder, recurrent, mild: Secondary | ICD-10-CM | POA: Diagnosis not present

## 2019-03-20 DIAGNOSIS — I10 Essential (primary) hypertension: Secondary | ICD-10-CM | POA: Diagnosis not present

## 2019-03-20 DIAGNOSIS — R7303 Prediabetes: Secondary | ICD-10-CM | POA: Diagnosis not present

## 2019-04-01 DIAGNOSIS — R7303 Prediabetes: Secondary | ICD-10-CM | POA: Diagnosis not present

## 2019-04-01 DIAGNOSIS — I10 Essential (primary) hypertension: Secondary | ICD-10-CM | POA: Diagnosis not present

## 2019-04-01 DIAGNOSIS — E611 Iron deficiency: Secondary | ICD-10-CM | POA: Diagnosis not present

## 2019-04-01 DIAGNOSIS — E559 Vitamin D deficiency, unspecified: Secondary | ICD-10-CM | POA: Diagnosis not present

## 2019-04-01 DIAGNOSIS — R5382 Chronic fatigue, unspecified: Secondary | ICD-10-CM | POA: Diagnosis not present

## 2019-04-01 DIAGNOSIS — E785 Hyperlipidemia, unspecified: Secondary | ICD-10-CM | POA: Diagnosis not present

## 2019-04-24 DIAGNOSIS — M5136 Other intervertebral disc degeneration, lumbar region: Secondary | ICD-10-CM | POA: Diagnosis not present

## 2019-04-24 DIAGNOSIS — M51369 Other intervertebral disc degeneration, lumbar region without mention of lumbar back pain or lower extremity pain: Secondary | ICD-10-CM | POA: Insufficient documentation

## 2019-04-24 DIAGNOSIS — M5416 Radiculopathy, lumbar region: Secondary | ICD-10-CM | POA: Diagnosis not present

## 2019-04-24 MED FILL — predniSONE 10 MG TABS: 10 | 6 days supply | Qty: 21 | Fill #0

## 2019-04-27 ENCOUNTER — Ambulatory Visit: Payer: 59

## 2019-05-08 DIAGNOSIS — M5416 Radiculopathy, lumbar region: Secondary | ICD-10-CM | POA: Diagnosis not present

## 2019-05-17 DIAGNOSIS — M5416 Radiculopathy, lumbar region: Secondary | ICD-10-CM | POA: Diagnosis not present

## 2019-05-29 MED FILL — ESCITALOPRAM 10 MG TABLET: 10 | 90 days supply | Qty: 90 | Fill #2

## 2019-05-29 MED FILL — LISINOPRIL-HCTZ 20-12.5 MG: 20-12.5 | 90 days supply | Qty: 90 | Fill #1

## 2019-05-29 MED FILL — ROSUVASTATIN CALCIUM 10 MG: 10 | 90 days supply | Qty: 90 | Fill #0

## 2019-06-10 IMAGING — US US ABDOMEN LIMITED
1 series · 14 of 25 positions shown · non-contrast
Comparison: None.

CLINICAL DATA: 51-year-old female with elevated LFTs.

EXAM:
ULTRASOUND ABDOMEN LIMITED RIGHT UPPER QUADRANT

[Series 1: us abdomen limited · 0.26mm/px · 14 of 48 slices shown]
[im 1/48]
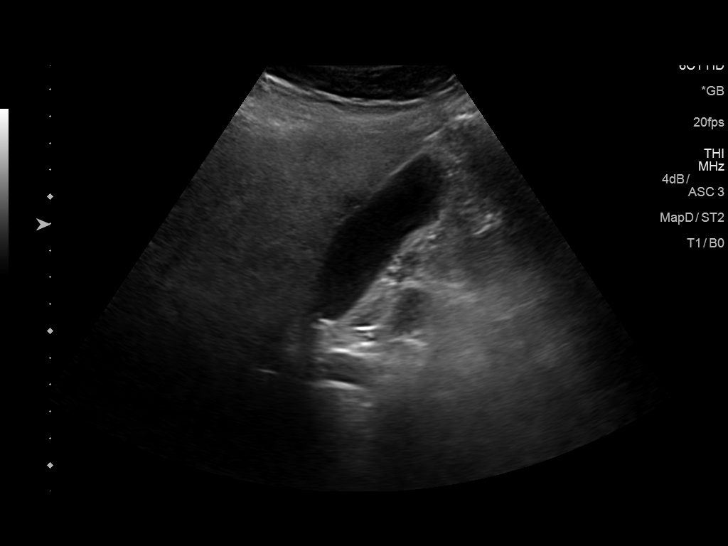
[im 4/48]
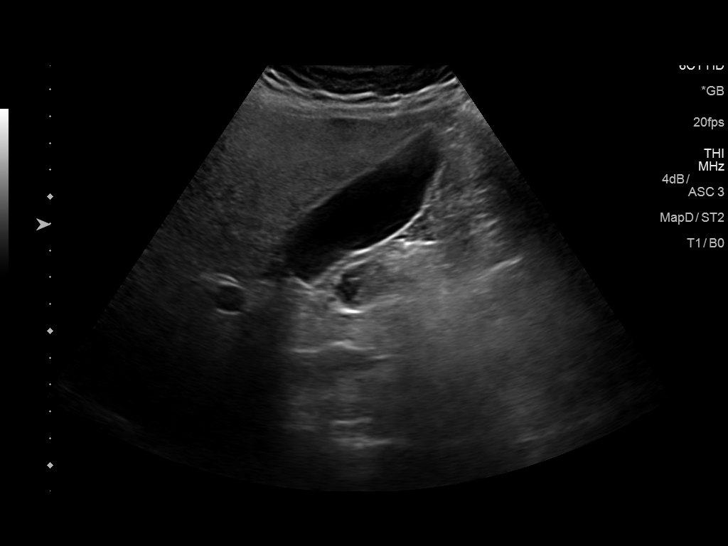
[im 8/48]
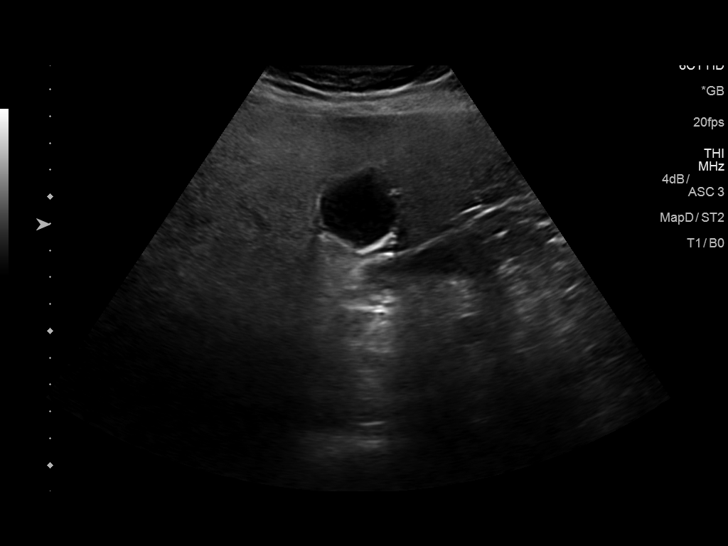
[im 12/48]
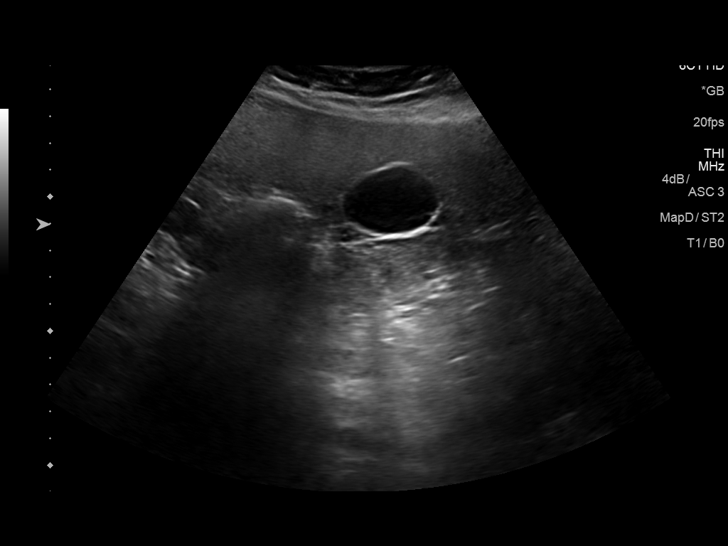
[im 16/48]
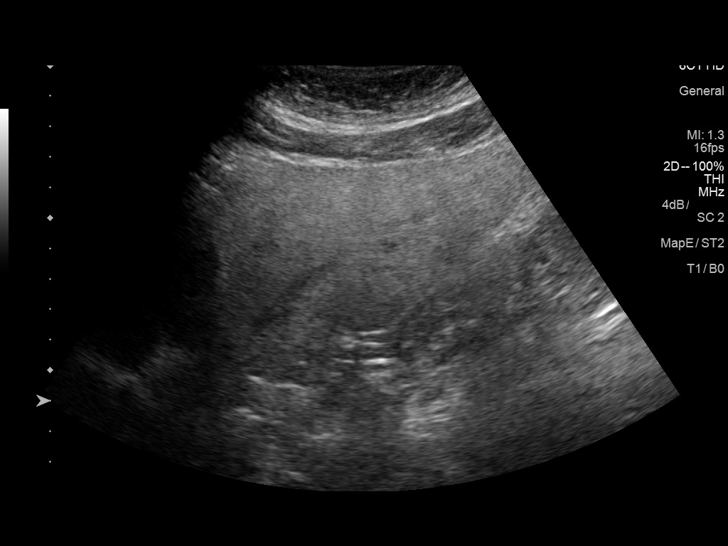
[im 18/48]
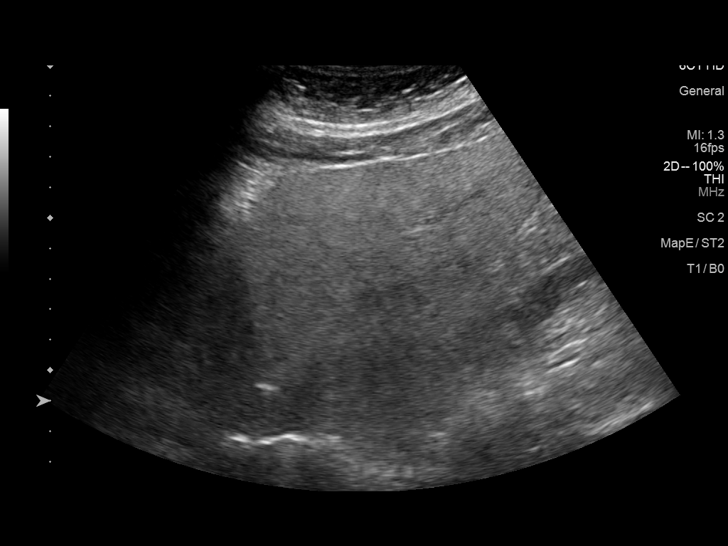
[im 22/48]
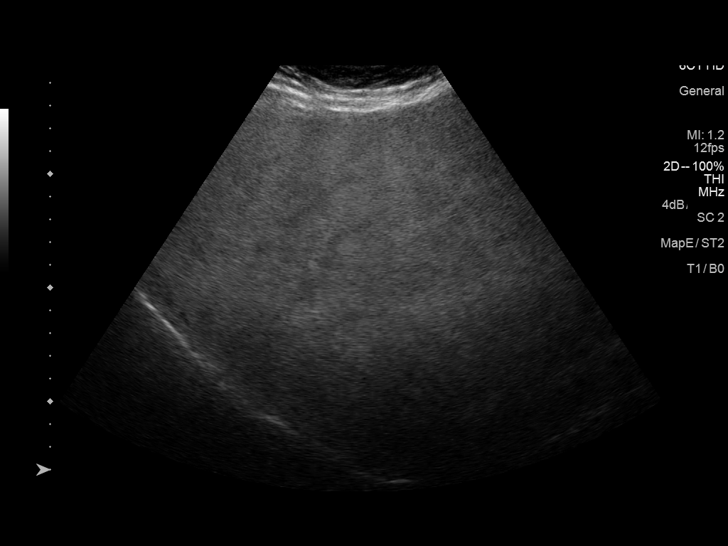
[im 26/48]
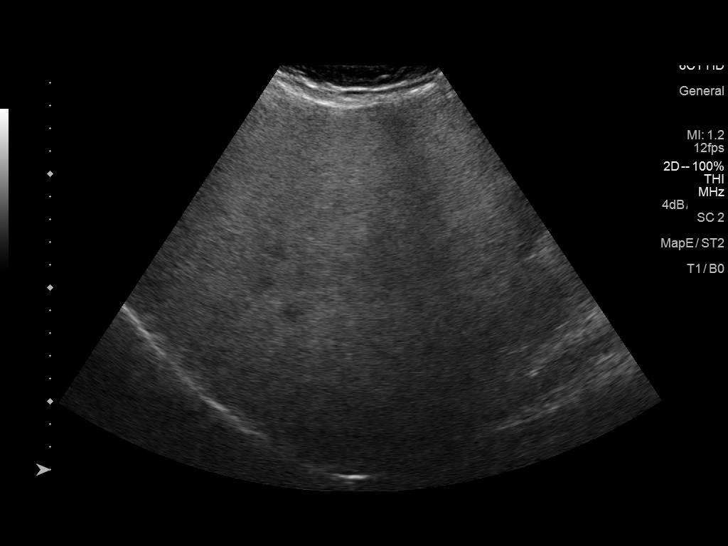
[im 30/48]
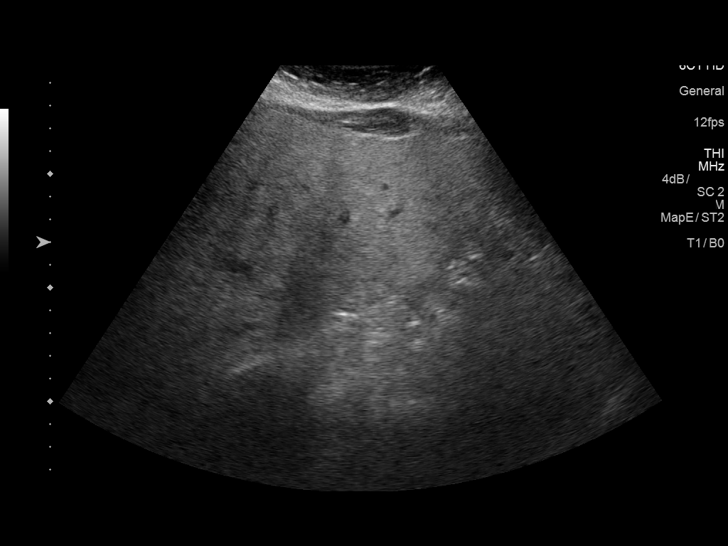
[im 32/48]
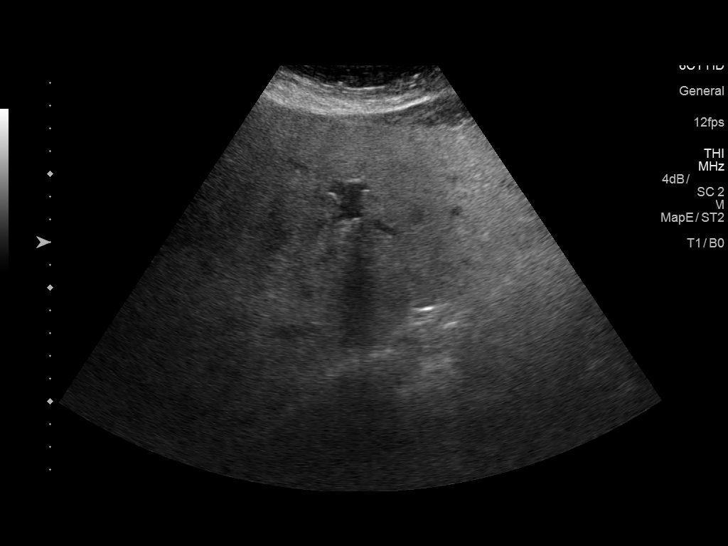
[im 36/48]
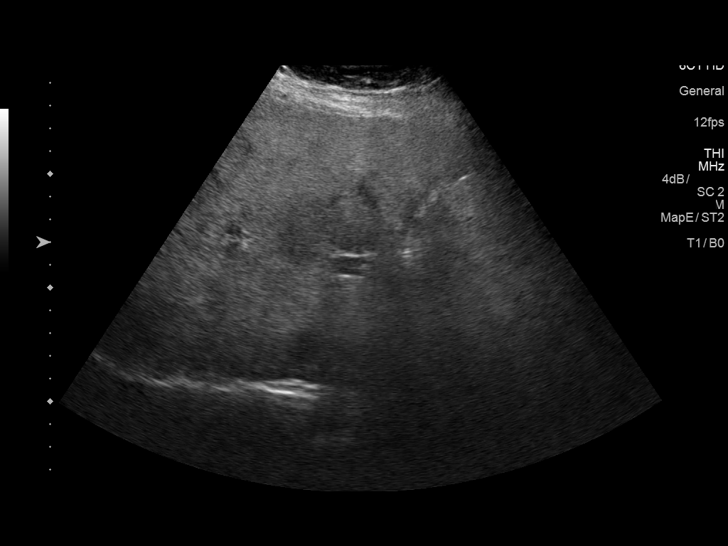
[im 40/48]
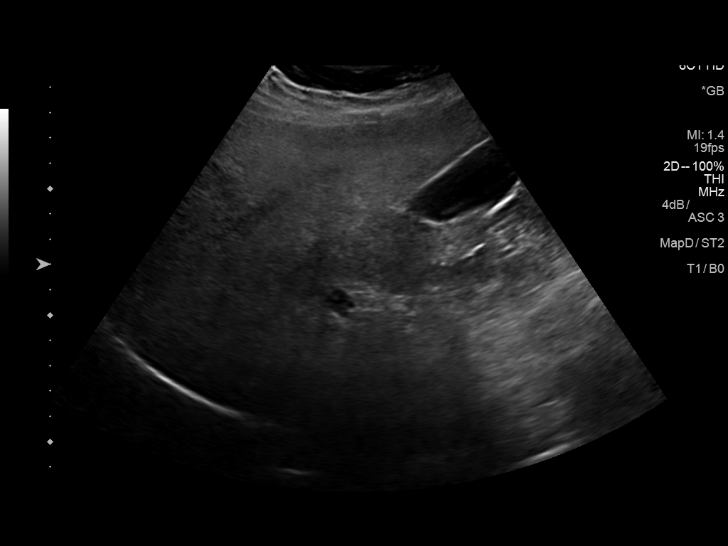
[im 44/48]
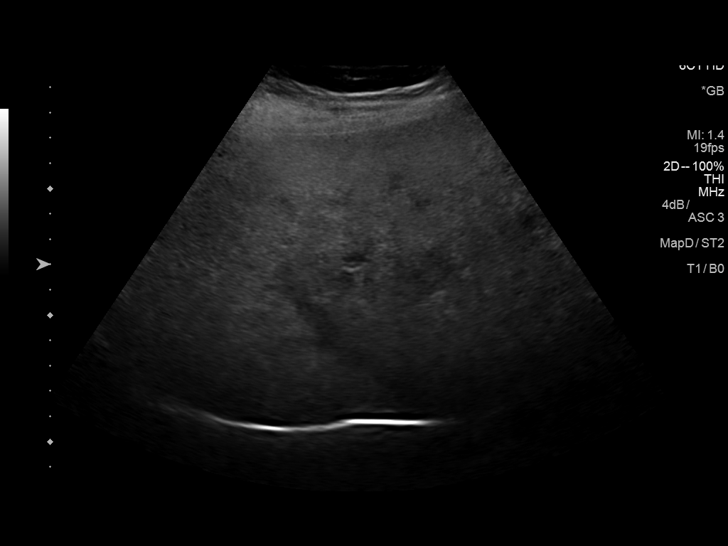
[im 48/48]
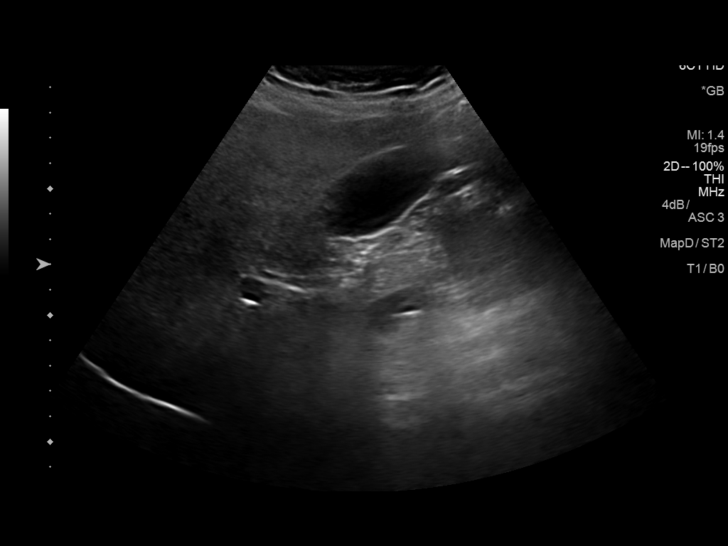

[14 of 25 positions shown; findings below may reference images not displayed]

FINDINGS: Gallbladder:

The gallbladder is unremarkable. There is no evidence of
cholelithiasis or acute cholecystitis.

Common bile duct:

Diameter: 4 mm.  No intrahepatic or extrahepatic biliary dilatation.

Liver:

Increased echogenicity and heterogeneous echotexture identified. No
focal hepatic masses are present. Portal vein is patent on color
Doppler imaging with normal direction of blood flow towards the
liver.
IMPRESSION: 1. Increased echogenicity and heterogeneous echotexture, likely
representing hepatic steatosis. No focal hepatic lesions.
2. Normal gallbladder.

## 2019-06-14 DIAGNOSIS — M5416 Radiculopathy, lumbar region: Secondary | ICD-10-CM | POA: Diagnosis not present

## 2019-06-21 DIAGNOSIS — M5416 Radiculopathy, lumbar region: Secondary | ICD-10-CM | POA: Diagnosis not present

## 2019-07-10 DIAGNOSIS — H52223 Regular astigmatism, bilateral: Secondary | ICD-10-CM | POA: Diagnosis not present

## 2019-07-10 DIAGNOSIS — H5213 Myopia, bilateral: Secondary | ICD-10-CM | POA: Diagnosis not present

## 2019-08-28 MED FILL — ROSUVASTATIN CALCIUM 10 MG: 10 | 90 days supply | Qty: 90 | Fill #1

## 2019-08-28 MED FILL — LISINOPRIL-HCTZ 20-12.5 MG: 20-12.5 | 90 days supply | Qty: 90 | Fill #2

## 2019-08-29 ENCOUNTER — Other Ambulatory Visit (HOSPITAL_COMMUNITY): Payer: Self-pay | Admitting: Family Medicine

## 2019-08-29 MED FILL — ESCITALOPRAM 10 MG TABLET: 10 | 90 days supply | Qty: 90 | Fill #0

## 2019-09-04 DIAGNOSIS — Z808 Family history of malignant neoplasm of other organs or systems: Secondary | ICD-10-CM | POA: Diagnosis not present

## 2019-09-04 DIAGNOSIS — L309 Dermatitis, unspecified: Secondary | ICD-10-CM | POA: Diagnosis not present

## 2019-09-04 DIAGNOSIS — L814 Other melanin hyperpigmentation: Secondary | ICD-10-CM | POA: Diagnosis not present

## 2019-09-04 DIAGNOSIS — L578 Other skin changes due to chronic exposure to nonionizing radiation: Secondary | ICD-10-CM | POA: Diagnosis not present

## 2019-09-04 DIAGNOSIS — D225 Melanocytic nevi of trunk: Secondary | ICD-10-CM | POA: Diagnosis not present

## 2019-09-04 DIAGNOSIS — D485 Neoplasm of uncertain behavior of skin: Secondary | ICD-10-CM | POA: Diagnosis not present

## 2019-09-04 DIAGNOSIS — L821 Other seborrheic keratosis: Secondary | ICD-10-CM | POA: Diagnosis not present

## 2019-09-04 DIAGNOSIS — I781 Nevus, non-neoplastic: Secondary | ICD-10-CM | POA: Diagnosis not present

## 2019-09-04 MED FILL — TRIAMCINOLONE 0.1% CREAM: 0.1 | 14 days supply | Qty: 60 | Fill #0

## 2019-09-09 DIAGNOSIS — Z5181 Encounter for therapeutic drug level monitoring: Secondary | ICD-10-CM | POA: Diagnosis not present

## 2019-09-09 DIAGNOSIS — R7303 Prediabetes: Secondary | ICD-10-CM | POA: Diagnosis not present

## 2019-09-09 DIAGNOSIS — Z0000 Encounter for general adult medical examination without abnormal findings: Secondary | ICD-10-CM | POA: Diagnosis not present

## 2019-09-09 DIAGNOSIS — E559 Vitamin D deficiency, unspecified: Secondary | ICD-10-CM | POA: Diagnosis not present

## 2019-09-09 DIAGNOSIS — R5382 Chronic fatigue, unspecified: Secondary | ICD-10-CM | POA: Diagnosis not present

## 2019-09-09 DIAGNOSIS — F330 Major depressive disorder, recurrent, mild: Secondary | ICD-10-CM | POA: Diagnosis not present

## 2019-09-09 DIAGNOSIS — E785 Hyperlipidemia, unspecified: Secondary | ICD-10-CM | POA: Diagnosis not present

## 2019-10-15 MED FILL — clonazePAM 0.5 MG TABS: 0.5 | 30 days supply | Qty: 30 | Fill #0

## 2019-10-22 DIAGNOSIS — N950 Postmenopausal bleeding: Secondary | ICD-10-CM | POA: Diagnosis not present

## 2019-10-30 DIAGNOSIS — Z1231 Encounter for screening mammogram for malignant neoplasm of breast: Secondary | ICD-10-CM | POA: Diagnosis not present

## 2019-10-30 DIAGNOSIS — Z6833 Body mass index (BMI) 33.0-33.9, adult: Secondary | ICD-10-CM | POA: Diagnosis not present

## 2019-10-30 DIAGNOSIS — Z01419 Encounter for gynecological examination (general) (routine) without abnormal findings: Secondary | ICD-10-CM | POA: Diagnosis not present

## 2019-11-08 MED FILL — IBUPROFEN 800 MG TABS: 800 | 7 days supply | Qty: 20 | Fill #0

## 2019-11-08 MED FILL — miSOPROStol 200 MCG TABS: 200 | 1 days supply | Qty: 1 | Fill #0

## 2019-11-13 DIAGNOSIS — N920 Excessive and frequent menstruation with regular cycle: Secondary | ICD-10-CM | POA: Diagnosis not present

## 2019-11-13 DIAGNOSIS — D259 Leiomyoma of uterus, unspecified: Secondary | ICD-10-CM | POA: Diagnosis not present

## 2019-11-13 DIAGNOSIS — N950 Postmenopausal bleeding: Secondary | ICD-10-CM | POA: Diagnosis not present

## 2019-11-13 DIAGNOSIS — D250 Submucous leiomyoma of uterus: Secondary | ICD-10-CM | POA: Diagnosis not present

## 2019-11-14 DIAGNOSIS — D259 Leiomyoma of uterus, unspecified: Secondary | ICD-10-CM | POA: Diagnosis not present

## 2019-11-14 DIAGNOSIS — N858 Other specified noninflammatory disorders of uterus: Secondary | ICD-10-CM | POA: Diagnosis not present

## 2019-12-04 ENCOUNTER — Other Ambulatory Visit (HOSPITAL_COMMUNITY): Payer: Self-pay | Admitting: Family Medicine

## 2019-12-04 MED FILL — ESCITALOPRAM 10 MG TABLET: 10 | 90 days supply | Qty: 90 | Fill #1

## 2019-12-04 MED FILL — clonazePAM 0.5 MG TABS: 0.5 | 30 days supply | Qty: 30 | Fill #1

## 2019-12-04 MED FILL — LISINOPRIL-HCTZ 20-12.5 MG: 20-12.5 | 90 days supply | Qty: 90 | Fill #3

## 2019-12-04 MED FILL — ROSUVASTATIN CALCIUM 10 MG: 10 | 90 days supply | Qty: 90 | Fill #0

## 2020-03-02 ENCOUNTER — Other Ambulatory Visit (HOSPITAL_COMMUNITY): Payer: Self-pay | Admitting: Family Medicine

## 2020-03-02 MED FILL — ESCITALOPRAM 10 MG TABLET: 10 | 90 days supply | Qty: 90 | Fill #2

## 2020-03-02 MED FILL — ROSUVASTATIN CALCIUM 10 MG: 10 | 90 days supply | Qty: 90 | Fill #1

## 2020-03-03 MED FILL — LISINOPRIL-HCTZ 20-12.5 MG: 20-12.5 | 90 days supply | Qty: 90 | Fill #0

## 2020-03-30 ENCOUNTER — Other Ambulatory Visit (HOSPITAL_COMMUNITY): Payer: Self-pay | Admitting: Family Medicine

## 2020-03-30 MED FILL — clonazePAM 0.5 MG TABS: 0.5 | 30 days supply | Qty: 30 | Fill #0

## 2020-05-07 ENCOUNTER — Encounter: Payer: Self-pay | Admitting: Podiatry

## 2020-05-07 ENCOUNTER — Ambulatory Visit: Payer: 59 | Admitting: Podiatry

## 2020-05-07 ENCOUNTER — Other Ambulatory Visit: Payer: Self-pay

## 2020-05-07 ENCOUNTER — Other Ambulatory Visit: Payer: Self-pay | Admitting: Podiatry

## 2020-05-07 DIAGNOSIS — L600 Ingrowing nail: Secondary | ICD-10-CM

## 2020-05-07 DIAGNOSIS — M79674 Pain in right toe(s): Secondary | ICD-10-CM

## 2020-05-07 DIAGNOSIS — I10 Essential (primary) hypertension: Secondary | ICD-10-CM | POA: Insufficient documentation

## 2020-05-07 DIAGNOSIS — K589 Irritable bowel syndrome without diarrhea: Secondary | ICD-10-CM | POA: Insufficient documentation

## 2020-05-07 DIAGNOSIS — R87619 Unspecified abnormal cytological findings in specimens from cervix uteri: Secondary | ICD-10-CM | POA: Insufficient documentation

## 2020-05-07 MED ORDER — CEPHALEXIN 500 MG PO CAPS: 500.0000 mg | ORAL_CAPSULE | Freq: Three times a day (TID) | ORAL | 0 refills | Status: DC

## 2020-05-07 MED FILL — CEPHALEXIN 500 MG CAPSULE: 500 | 7 days supply | Qty: 21 | Fill #0

## 2020-05-07 NOTE — Patient Instructions (Signed)

## 2020-05-12 NOTE — Progress Notes (Signed)
Subjective:   Patient ID: Terry English, female   DOB: 55 y.o.   MRN: [id number]   HPI 55 year old female presents the office today for concerns of ingrown toenails of both of her big toes of the right side worse than left.  She states that she does get pedicures.  Denies any redness or drainage or any swelling.  The area is tender with pressure in shoes.  She has no other concerns today.   Review of Systems  All other systems reviewed and are negative.  Past Medical History:  Diagnosis Date  . Allergy   . Anxiety   . Depression   . Hyperlipidemia    controlled  . Hypertension    controlled    Past Surgical History:  Procedure Laterality Date  . BLADDER SUSPENSION    . BUNIONECTOMY    . CESAREAN SECTION     x1  . NASAL SEPTUM SURGERY    . TONSILLECTOMY       Current Outpatient Medications:  .  cephALEXin (KEFLEX) 500 MG capsule, Take 1 capsule (500 mg total) by mouth 3 (three) times daily., Disp: 21 capsule, Rfl: 0 .  cholecalciferol (VITAMIN D3) 25 MCG (1000 UT) tablet, Take 1,000 Units by mouth daily., Disp: , Rfl:  .  clonazePAM (KLONOPIN) 0.5 MG tablet, clonazepam 0.5 mg tablet, Disp: , Rfl:  .  ergocalciferol (VITAMIN D2) 1.25 MG (50000 UT) capsule, Vitamin D2 1,250 mcg (50,000 unit) capsule, Disp: , Rfl:  .  escitalopram (LEXAPRO) 10 MG tablet, , Disp: , Rfl:  .  ibuprofen (ADVIL) 800 MG tablet, ibuprofen 800 mg tablet  TAKE 1 TABLET BY MOUTH EVERY 8 HOURS AS NEEDED, Disp: , Rfl:  .  lisinopril-hydrochlorothiazide (ZESTORETIC) 20-12.5 MG tablet, , Disp: , Rfl:  .  misoprostol (CYTOTEC) 200 MCG tablet, misoprostol 200 mcg tablet  INSERT ONE TABLET PER VAGINA THE NIGHT PRIOR TO PROCEDURE, Disp: , Rfl:  .  MODERNA COVID-19 VACCINE 100 MCG/0.5ML injection, , Disp: , Rfl:  .  Nutritional Supplements (ESTROVEN PO), Take by mouth daily. estroven complete, Disp: , Rfl:  .  predniSONE (DELTASONE) 10 MG tablet, prednisone 10 mg tablet, Disp: , Rfl:  .  rosuvastatin (CRESTOR)  10 MG tablet, , Disp: , Rfl:  .  triamcinolone cream (KENALOG) 0.1 %, triamcinolone acetonide 0.1 % topical cream, Disp: , Rfl:   Allergies  Allergen Reactions  . Imitrex [Sumatriptan] Palpitations         Objective:  Physical Exam  General: AAO x3, NAD  Dermatological: Incurvation present to the medial lateral nail borders of the right hallux worse than left.  There is localized edema erythema with faint erythema likely more from inflammation as opposed to infection.  No drainage or pus or any signs of infection.  There are no open lesions identified today.  Vascular: Dorsalis Pedis artery and Posterior Tibial artery pedal pulses are 2/4 bilateral with immedate capillary fill time. There is no pain with calf compression, swelling, warmth, erythema.   Neruologic: Grossly intact via light touch bilateral.   Musculoskeletal: Tenderness on the right hallux ingrown portion of the nails today.  Muscular strength 5/5 in all groups tested bilateral.  Gait: Unassisted, Nonantalgic.       Assessment:   Ingrown hallux toenail right side worse than left     Plan:   -Treatment options discussed including all alternatives, risks, and complications -Etiology of symptoms were discussed -At this time, the patient is requesting partial nail removal with chemical matricectomy to  the symptomatic portion of the nail. Risks and complications were discussed with the patient for which they understand and written consent was obtained. Under sterile conditions a total of 3 mL of a mixture of 2% lidocaine plain and 0.5% Marcaine plain was infiltrated in a hallux block fashion. Once anesthetized, the skin was prepped in sterile fashion. A tourniquet was then applied. Next the medial and lateral aspect of hallux nail border was then sharply excised making sure to remove the entire offending nail border. Once the nails were ensured to be removed area was debrided and the underlying skin was intact. There is  no purulence identified in the procedure. Next phenol was then applied under standard conditions and copiously irrigated. Silvadene was applied. A dry sterile dressing was applied. After application of the dressing the tourniquet was removed and there is found to be an immediate capillary refill time to the digit. The patient tolerated the procedure well any complications. Post procedure instructions were discussed the patient for which he verbally understood. Follow-up in one week for nail check or sooner if any problems are to arise. Discussed signs/symptoms of infection and directed to call the office immediately should any occur or go directly to the emergency room. In the meantime, encouraged to call the office with any questions, concerns, changes symptoms. -Keflex  Trula Slade DPM

## 2020-05-25 ENCOUNTER — Ambulatory Visit: Payer: 59 | Admitting: Podiatry

## 2020-05-25 ENCOUNTER — Other Ambulatory Visit: Payer: Self-pay

## 2020-05-25 DIAGNOSIS — L600 Ingrowing nail: Secondary | ICD-10-CM

## 2020-05-25 DIAGNOSIS — B351 Tinea unguium: Secondary | ICD-10-CM | POA: Diagnosis not present

## 2020-05-25 NOTE — Patient Instructions (Signed)
You can try fungi-nail on the toenail  Continue soaking in epsom salts twice a day followed by antibiotic ointment and a band-aid. Can leave uncovered at night. Continue this until completely healed.  If the area has not healed in 2 weeks, call the office for follow-up appointment, or sooner if any problems arise.  Monitor for any signs/symptoms of infection. Call the office immediately if any occur or go directly to the emergency room. Call with any questions/concerns.

## 2020-05-25 NOTE — Progress Notes (Signed)
Subjective: Terry English is a 55 y.o.  right returns to office today for follow up evaluation after having partial Hallux nail avulsion performed. Patient has been soaking using epsom salts and applying topical antibiotic covered with bandaid daily.  Overall states it feels much better.  She has had some chronic yellow discoloration of the nail.  She denies any drainage or pus.  Patient denies fevers, chills, nausea, vomiting. Denies any calf pain, chest pain, SOB.   Objective:  Vitals: Reviewed  General: Well developed, nourished, in no acute distress, alert and oriented x3   Dermatology: Skin is warm, dry and supple bilateral. LEFT hallux nail border appears to be clean, dry, with mild surrounding scab. There is no surrounding erythema, edema, drainage/purulence. The remaining nails appear unremarkable at this time. There are no other lesions or other signs of infection present.  Neurovascular status: Intact. No lower extremity swelling; No pain with calf compression bilateral.  Musculoskeletal: No tenderness to palpation of the left hallux nail fold. Muscular strength within normal limits bilateral.   Assesement and Plan: S/p partial nail avulsion, doing well.   -Continue soaking in epsom salts twice a day followed by antibiotic ointment and a band-aid. Can leave uncovered at night. Continue this until completely healed.  -If the area has not healed in 2 weeks, call the office for follow-up appointment, or sooner if any problems arise.  -Discussed treatment options for nail fungus.  Discussed reflux to proceed with topical over-the-counter medication.  We will start Fungi-Nail. -Monitor for any signs/symptoms of infection. Call the office immediately if any occur or go directly to the emergency room. Call with any questions/concerns.  Celesta Gentile, DPM

## 2020-06-01 ENCOUNTER — Other Ambulatory Visit (HOSPITAL_COMMUNITY): Payer: Self-pay

## 2020-06-01 MED FILL — Escitalopram Oxalate Tab 10 MG (Base Equiv): ORAL | 90 days supply | Qty: 90 | Fill #0 | Status: AC

## 2020-06-01 MED FILL — Rosuvastatin Calcium Tab 10 MG: ORAL | 90 days supply | Qty: 90 | Fill #0 | Status: AC

## 2020-06-01 MED FILL — Lisinopril & Hydrochlorothiazide Tab 20-12.5 MG: ORAL | 90 days supply | Qty: 90 | Fill #0 | Status: AC

## 2020-06-02 ENCOUNTER — Other Ambulatory Visit (HOSPITAL_COMMUNITY): Payer: Self-pay

## 2020-06-08 DIAGNOSIS — H524 Presbyopia: Secondary | ICD-10-CM | POA: Diagnosis not present

## 2020-07-14 DIAGNOSIS — H2513 Age-related nuclear cataract, bilateral: Secondary | ICD-10-CM | POA: Diagnosis not present

## 2020-07-14 DIAGNOSIS — H18413 Arcus senilis, bilateral: Secondary | ICD-10-CM | POA: Diagnosis not present

## 2020-07-14 DIAGNOSIS — H25043 Posterior subcapsular polar age-related cataract, bilateral: Secondary | ICD-10-CM | POA: Diagnosis not present

## 2020-07-14 DIAGNOSIS — H2511 Age-related nuclear cataract, right eye: Secondary | ICD-10-CM | POA: Diagnosis not present

## 2020-07-14 DIAGNOSIS — H25013 Cortical age-related cataract, bilateral: Secondary | ICD-10-CM | POA: Diagnosis not present

## 2020-08-10 ENCOUNTER — Other Ambulatory Visit (HOSPITAL_BASED_OUTPATIENT_CLINIC_OR_DEPARTMENT_OTHER): Payer: Self-pay

## 2020-08-10 MED ORDER — PREDNISONE 10 MG PO TABS
ORAL_TABLET | ORAL | 0 refills | Status: DC
  Filled 2020-08-10: qty 21, 6d supply, fill #0

## 2020-09-01 ENCOUNTER — Other Ambulatory Visit (HOSPITAL_BASED_OUTPATIENT_CLINIC_OR_DEPARTMENT_OTHER): Payer: Self-pay

## 2020-09-01 ENCOUNTER — Other Ambulatory Visit (HOSPITAL_COMMUNITY): Payer: Self-pay

## 2020-09-01 MED ORDER — ESCITALOPRAM OXALATE 10 MG PO TABS
10.0000 mg | ORAL_TABLET | Freq: Every day | ORAL | 3 refills | Status: DC
  Filled 2020-09-01 (×2): qty 90, 90d supply, fill #0
  Filled 2020-11-26 – 2020-11-30 (×2): qty 90, 90d supply, fill #1

## 2020-09-01 MED FILL — Rosuvastatin Calcium Tab 10 MG: ORAL | 90 days supply | Qty: 90 | Fill #0 | Status: AC

## 2020-09-01 MED FILL — Lisinopril & Hydrochlorothiazide Tab 20-12.5 MG: ORAL | 90 days supply | Qty: 90 | Fill #0 | Status: AC

## 2020-09-03 DIAGNOSIS — L821 Other seborrheic keratosis: Secondary | ICD-10-CM | POA: Diagnosis not present

## 2020-09-03 DIAGNOSIS — D225 Melanocytic nevi of trunk: Secondary | ICD-10-CM | POA: Diagnosis not present

## 2020-09-03 DIAGNOSIS — L814 Other melanin hyperpigmentation: Secondary | ICD-10-CM | POA: Diagnosis not present

## 2020-09-03 DIAGNOSIS — Z808 Family history of malignant neoplasm of other organs or systems: Secondary | ICD-10-CM | POA: Diagnosis not present

## 2020-09-03 DIAGNOSIS — L578 Other skin changes due to chronic exposure to nonionizing radiation: Secondary | ICD-10-CM | POA: Diagnosis not present

## 2020-09-11 ENCOUNTER — Other Ambulatory Visit (HOSPITAL_BASED_OUTPATIENT_CLINIC_OR_DEPARTMENT_OTHER): Payer: Self-pay

## 2020-09-11 MED ORDER — PREDNISOLONE ACETATE 1 % OP SUSP
OPHTHALMIC | 1 refills | Status: DC
Start: 2020-09-11 — End: 2022-01-17
  Filled 2020-09-11: qty 5, 25d supply, fill #0
  Filled 2020-10-22: qty 5, 25d supply, fill #1

## 2020-09-11 MED ORDER — PROLENSA 0.07 % OP SOLN
OPHTHALMIC | 1 refills | Status: DC
  Filled 2020-09-11: qty 3, 60d supply, fill #0
  Filled 2020-11-27 – 2020-11-30 (×2): qty 3, 60d supply, fill #1

## 2020-09-11 MED ORDER — BESIVANCE 0.6 % OP SUSP
OPHTHALMIC | 1 refills | Status: DC
  Filled 2020-09-11: qty 5, 33d supply, fill #0
  Filled 2020-11-30: qty 5, 33d supply, fill #1

## 2020-09-11 MED ORDER — DUREZOL 0.05 % OP EMUL
OPHTHALMIC | 1 refills | Status: DC
  Filled 2020-09-11: qty 5, 21d supply, fill #0

## 2020-09-11 MED FILL — Clonazepam Tab 0.5 MG: ORAL | 30 days supply | Qty: 30 | Fill #0 | Status: AC

## 2020-09-14 ENCOUNTER — Other Ambulatory Visit (HOSPITAL_BASED_OUTPATIENT_CLINIC_OR_DEPARTMENT_OTHER): Payer: Self-pay

## 2020-09-14 DIAGNOSIS — I10 Essential (primary) hypertension: Secondary | ICD-10-CM | POA: Diagnosis not present

## 2020-09-14 DIAGNOSIS — E559 Vitamin D deficiency, unspecified: Secondary | ICD-10-CM | POA: Diagnosis not present

## 2020-09-14 DIAGNOSIS — R7303 Prediabetes: Secondary | ICD-10-CM | POA: Diagnosis not present

## 2020-09-14 DIAGNOSIS — E785 Hyperlipidemia, unspecified: Secondary | ICD-10-CM | POA: Diagnosis not present

## 2020-09-16 ENCOUNTER — Other Ambulatory Visit (HOSPITAL_BASED_OUTPATIENT_CLINIC_OR_DEPARTMENT_OTHER): Payer: Self-pay

## 2020-09-16 DIAGNOSIS — Z0000 Encounter for general adult medical examination without abnormal findings: Secondary | ICD-10-CM | POA: Diagnosis not present

## 2020-09-17 ENCOUNTER — Other Ambulatory Visit (HOSPITAL_BASED_OUTPATIENT_CLINIC_OR_DEPARTMENT_OTHER): Payer: Self-pay

## 2020-09-17 MED ORDER — CYCLOBENZAPRINE HCL 10 MG PO TABS
ORAL_TABLET | ORAL | 3 refills | Status: DC
  Filled 2020-09-17: qty 30, 30d supply, fill #0
  Filled 2020-11-27 – 2021-05-27 (×2): qty 30, 30d supply, fill #1

## 2020-10-02 ENCOUNTER — Other Ambulatory Visit (HOSPITAL_BASED_OUTPATIENT_CLINIC_OR_DEPARTMENT_OTHER): Payer: Self-pay

## 2020-10-02 ENCOUNTER — Other Ambulatory Visit (HOSPITAL_COMMUNITY): Payer: Self-pay

## 2020-10-02 MED ORDER — CLONAZEPAM 0.5 MG PO TABS
0.5000 mg | ORAL_TABLET | Freq: Every evening | ORAL | 5 refills | Status: DC | PRN
  Filled 2020-10-02: qty 30, 30d supply, fill #0

## 2020-10-05 ENCOUNTER — Other Ambulatory Visit (HOSPITAL_BASED_OUTPATIENT_CLINIC_OR_DEPARTMENT_OTHER): Payer: Self-pay

## 2020-10-05 DIAGNOSIS — H52201 Unspecified astigmatism, right eye: Secondary | ICD-10-CM | POA: Diagnosis not present

## 2020-10-05 DIAGNOSIS — H2511 Age-related nuclear cataract, right eye: Secondary | ICD-10-CM | POA: Diagnosis not present

## 2020-10-05 MED ORDER — CLONAZEPAM 0.5 MG PO TABS
0.5000 mg | ORAL_TABLET | Freq: Every evening | ORAL | 5 refills | Status: DC | PRN
  Filled 2020-10-05 – 2020-10-06 (×2): qty 30, 30d supply, fill #0
  Filled 2020-12-09 – 2021-03-15 (×2): qty 30, 30d supply, fill #1

## 2020-10-06 ENCOUNTER — Other Ambulatory Visit (HOSPITAL_BASED_OUTPATIENT_CLINIC_OR_DEPARTMENT_OTHER): Payer: Self-pay

## 2020-10-06 DIAGNOSIS — H2512 Age-related nuclear cataract, left eye: Secondary | ICD-10-CM | POA: Diagnosis not present

## 2020-10-06 MED ORDER — PROLENSA 0.07 % OP SOLN
OPHTHALMIC | 1 refills | Status: DC
  Filled 2020-10-06: qty 3, 28d supply, fill #0

## 2020-10-06 MED ORDER — PREDNISOLONE ACETATE 1 % OP SUSP
OPHTHALMIC | 1 refills | Status: DC
  Filled 2020-10-06: qty 5, 22d supply, fill #0
  Filled 2020-11-10 – 2020-11-30 (×4): qty 5, 25d supply, fill #0

## 2020-10-06 MED ORDER — BESIVANCE 0.6 % OP SUSP
OPHTHALMIC | 1 refills | Status: DC
  Filled 2020-10-06: qty 5, 22d supply, fill #0
  Filled 2020-10-22: qty 5, 33d supply, fill #0
  Filled 2020-11-10 – 2020-11-26 (×2): qty 5, 33d supply, fill #1

## 2020-10-07 ENCOUNTER — Other Ambulatory Visit (HOSPITAL_COMMUNITY): Payer: Self-pay

## 2020-10-13 ENCOUNTER — Other Ambulatory Visit (HOSPITAL_COMMUNITY): Payer: Self-pay

## 2020-10-13 MED ORDER — ESCITALOPRAM OXALATE 10 MG PO TABS
15.0000 mg | ORAL_TABLET | Freq: Every day | ORAL | 3 refills | Status: DC
  Filled 2020-10-13 – 2020-11-27 (×2): qty 135, 90d supply, fill #0
  Filled 2021-02-18: qty 135, 90d supply, fill #1
  Filled 2021-05-19: qty 135, 90d supply, fill #2
  Filled 2021-08-17: qty 135, 90d supply, fill #3

## 2020-10-16 ENCOUNTER — Other Ambulatory Visit (HOSPITAL_COMMUNITY): Payer: Self-pay

## 2020-10-22 ENCOUNTER — Other Ambulatory Visit (HOSPITAL_BASED_OUTPATIENT_CLINIC_OR_DEPARTMENT_OTHER): Payer: Self-pay

## 2020-10-26 DIAGNOSIS — H5212 Myopia, left eye: Secondary | ICD-10-CM | POA: Diagnosis not present

## 2020-10-26 DIAGNOSIS — H52202 Unspecified astigmatism, left eye: Secondary | ICD-10-CM | POA: Diagnosis not present

## 2020-10-26 DIAGNOSIS — H2512 Age-related nuclear cataract, left eye: Secondary | ICD-10-CM | POA: Diagnosis not present

## 2020-11-10 ENCOUNTER — Other Ambulatory Visit (HOSPITAL_BASED_OUTPATIENT_CLINIC_OR_DEPARTMENT_OTHER): Payer: Self-pay

## 2020-11-11 ENCOUNTER — Other Ambulatory Visit (HOSPITAL_BASED_OUTPATIENT_CLINIC_OR_DEPARTMENT_OTHER): Payer: Self-pay

## 2020-11-25 ENCOUNTER — Other Ambulatory Visit (HOSPITAL_BASED_OUTPATIENT_CLINIC_OR_DEPARTMENT_OTHER): Payer: Self-pay

## 2020-11-26 ENCOUNTER — Other Ambulatory Visit (HOSPITAL_BASED_OUTPATIENT_CLINIC_OR_DEPARTMENT_OTHER): Payer: Self-pay

## 2020-11-26 MED ORDER — LISINOPRIL-HYDROCHLOROTHIAZIDE 20-12.5 MG PO TABS
1.0000 | ORAL_TABLET | Freq: Every day | ORAL | 3 refills | Status: DC
  Filled 2020-11-26: qty 90, 90d supply, fill #0
  Filled 2021-02-22: qty 90, 90d supply, fill #1
  Filled 2021-05-24: qty 90, 90d supply, fill #2
  Filled 2021-08-20: qty 90, 90d supply, fill #3

## 2020-11-26 MED ORDER — ROSUVASTATIN CALCIUM 10 MG PO TABS
10.0000 mg | ORAL_TABLET | Freq: Every day | ORAL | 3 refills | Status: DC
  Filled 2020-11-26: qty 90, 90d supply, fill #0
  Filled 2021-02-12: qty 90, 90d supply, fill #1
  Filled 2021-05-06: qty 90, 90d supply, fill #2
  Filled 2021-07-28 – 2021-08-02 (×4): qty 90, 90d supply, fill #3

## 2020-11-27 ENCOUNTER — Other Ambulatory Visit (HOSPITAL_BASED_OUTPATIENT_CLINIC_OR_DEPARTMENT_OTHER): Payer: Self-pay

## 2020-11-30 ENCOUNTER — Other Ambulatory Visit (HOSPITAL_COMMUNITY): Payer: Self-pay

## 2020-11-30 ENCOUNTER — Other Ambulatory Visit (HOSPITAL_BASED_OUTPATIENT_CLINIC_OR_DEPARTMENT_OTHER): Payer: Self-pay

## 2020-11-30 MED ORDER — ESCITALOPRAM OXALATE 10 MG PO TABS
15.0000 mg | ORAL_TABLET | Freq: Every day | ORAL | 3 refills | Status: DC
  Filled 2021-11-22: qty 135, 90d supply, fill #0

## 2020-12-07 ENCOUNTER — Other Ambulatory Visit (HOSPITAL_BASED_OUTPATIENT_CLINIC_OR_DEPARTMENT_OTHER): Payer: Self-pay

## 2020-12-08 ENCOUNTER — Other Ambulatory Visit (HOSPITAL_BASED_OUTPATIENT_CLINIC_OR_DEPARTMENT_OTHER): Payer: Self-pay

## 2020-12-09 ENCOUNTER — Other Ambulatory Visit (HOSPITAL_BASED_OUTPATIENT_CLINIC_OR_DEPARTMENT_OTHER): Payer: Self-pay

## 2020-12-11 ENCOUNTER — Other Ambulatory Visit: Payer: Self-pay | Admitting: Obstetrics and Gynecology

## 2020-12-11 DIAGNOSIS — N644 Mastodynia: Secondary | ICD-10-CM | POA: Diagnosis not present

## 2020-12-18 ENCOUNTER — Other Ambulatory Visit: Payer: 59

## 2020-12-30 ENCOUNTER — Other Ambulatory Visit (HOSPITAL_BASED_OUTPATIENT_CLINIC_OR_DEPARTMENT_OTHER): Payer: Self-pay

## 2021-01-05 ENCOUNTER — Other Ambulatory Visit: Payer: Self-pay

## 2021-01-05 ENCOUNTER — Ambulatory Visit
Admission: RE | Admit: 2021-01-05 | Discharge: 2021-01-05 | Disposition: A | Discharge status: Home / Self Care | Payer: 59 | Source: Ambulatory Visit | Attending: Obstetrics and Gynecology | Admitting: Obstetrics and Gynecology

## 2021-01-05 DIAGNOSIS — N644 Mastodynia: Secondary | ICD-10-CM

## 2021-01-05 DIAGNOSIS — R928 Other abnormal and inconclusive findings on diagnostic imaging of breast: Secondary | ICD-10-CM | POA: Diagnosis not present

## 2021-01-13 DIAGNOSIS — H43391 Other vitreous opacities, right eye: Secondary | ICD-10-CM | POA: Diagnosis not present

## 2021-01-14 DIAGNOSIS — Z01419 Encounter for gynecological examination (general) (routine) without abnormal findings: Secondary | ICD-10-CM | POA: Diagnosis not present

## 2021-01-14 DIAGNOSIS — H4311 Vitreous hemorrhage, right eye: Secondary | ICD-10-CM | POA: Diagnosis not present

## 2021-01-14 DIAGNOSIS — H35413 Lattice degeneration of retina, bilateral: Secondary | ICD-10-CM | POA: Diagnosis not present

## 2021-01-14 DIAGNOSIS — Z6832 Body mass index (BMI) 32.0-32.9, adult: Secondary | ICD-10-CM | POA: Diagnosis not present

## 2021-01-14 DIAGNOSIS — H43813 Vitreous degeneration, bilateral: Secondary | ICD-10-CM | POA: Diagnosis not present

## 2021-01-14 DIAGNOSIS — H33321 Round hole, right eye: Secondary | ICD-10-CM | POA: Diagnosis not present

## 2021-01-19 ENCOUNTER — Other Ambulatory Visit: Payer: 59

## 2021-01-19 DIAGNOSIS — Z13820 Encounter for screening for osteoporosis: Secondary | ICD-10-CM | POA: Diagnosis not present

## 2021-01-19 DIAGNOSIS — N958 Other specified menopausal and perimenopausal disorders: Secondary | ICD-10-CM | POA: Diagnosis not present

## 2021-01-28 DIAGNOSIS — H31091 Other chorioretinal scars, right eye: Secondary | ICD-10-CM | POA: Diagnosis not present

## 2021-01-28 DIAGNOSIS — H43391 Other vitreous opacities, right eye: Secondary | ICD-10-CM | POA: Diagnosis not present

## 2021-01-28 DIAGNOSIS — H43812 Vitreous degeneration, left eye: Secondary | ICD-10-CM | POA: Diagnosis not present

## 2021-02-05 ENCOUNTER — Other Ambulatory Visit (HOSPITAL_BASED_OUTPATIENT_CLINIC_OR_DEPARTMENT_OTHER): Payer: Self-pay

## 2021-02-05 DIAGNOSIS — J019 Acute sinusitis, unspecified: Secondary | ICD-10-CM | POA: Diagnosis not present

## 2021-02-05 DIAGNOSIS — J3489 Other specified disorders of nose and nasal sinuses: Secondary | ICD-10-CM | POA: Diagnosis not present

## 2021-02-05 DIAGNOSIS — H6590 Unspecified nonsuppurative otitis media, unspecified ear: Secondary | ICD-10-CM | POA: Diagnosis not present

## 2021-02-05 MED ORDER — IPRATROPIUM BROMIDE 0.06 % NA SOLN
NASAL | 0 refills | Status: DC
  Filled 2021-02-05: qty 15, 4d supply, fill #0

## 2021-02-08 ENCOUNTER — Other Ambulatory Visit (HOSPITAL_BASED_OUTPATIENT_CLINIC_OR_DEPARTMENT_OTHER): Payer: Self-pay

## 2021-02-12 ENCOUNTER — Other Ambulatory Visit (HOSPITAL_BASED_OUTPATIENT_CLINIC_OR_DEPARTMENT_OTHER): Payer: Self-pay

## 2021-02-18 ENCOUNTER — Other Ambulatory Visit (HOSPITAL_BASED_OUTPATIENT_CLINIC_OR_DEPARTMENT_OTHER): Payer: Self-pay

## 2021-02-23 ENCOUNTER — Other Ambulatory Visit (HOSPITAL_BASED_OUTPATIENT_CLINIC_OR_DEPARTMENT_OTHER): Payer: Self-pay

## 2021-03-15 ENCOUNTER — Other Ambulatory Visit (HOSPITAL_BASED_OUTPATIENT_CLINIC_OR_DEPARTMENT_OTHER): Payer: Self-pay

## 2021-03-30 ENCOUNTER — Other Ambulatory Visit (HOSPITAL_BASED_OUTPATIENT_CLINIC_OR_DEPARTMENT_OTHER): Payer: Self-pay

## 2021-04-26 ENCOUNTER — Other Ambulatory Visit (HOSPITAL_BASED_OUTPATIENT_CLINIC_OR_DEPARTMENT_OTHER): Payer: Self-pay

## 2021-05-06 ENCOUNTER — Other Ambulatory Visit (HOSPITAL_BASED_OUTPATIENT_CLINIC_OR_DEPARTMENT_OTHER): Payer: Self-pay

## 2021-05-19 ENCOUNTER — Other Ambulatory Visit (HOSPITAL_BASED_OUTPATIENT_CLINIC_OR_DEPARTMENT_OTHER): Payer: Self-pay

## 2021-05-24 ENCOUNTER — Other Ambulatory Visit (HOSPITAL_BASED_OUTPATIENT_CLINIC_OR_DEPARTMENT_OTHER): Payer: Self-pay

## 2021-05-27 ENCOUNTER — Other Ambulatory Visit (HOSPITAL_BASED_OUTPATIENT_CLINIC_OR_DEPARTMENT_OTHER): Payer: Self-pay

## 2021-06-25 DIAGNOSIS — H5213 Myopia, bilateral: Secondary | ICD-10-CM | POA: Diagnosis not present

## 2021-07-28 ENCOUNTER — Other Ambulatory Visit (HOSPITAL_BASED_OUTPATIENT_CLINIC_OR_DEPARTMENT_OTHER): Payer: Self-pay

## 2021-07-29 ENCOUNTER — Other Ambulatory Visit (HOSPITAL_BASED_OUTPATIENT_CLINIC_OR_DEPARTMENT_OTHER): Payer: Self-pay

## 2021-07-30 ENCOUNTER — Other Ambulatory Visit (HOSPITAL_BASED_OUTPATIENT_CLINIC_OR_DEPARTMENT_OTHER): Payer: Self-pay

## 2021-08-02 ENCOUNTER — Other Ambulatory Visit (HOSPITAL_BASED_OUTPATIENT_CLINIC_OR_DEPARTMENT_OTHER): Payer: Self-pay

## 2021-08-17 ENCOUNTER — Other Ambulatory Visit (HOSPITAL_BASED_OUTPATIENT_CLINIC_OR_DEPARTMENT_OTHER): Payer: Self-pay

## 2021-08-20 ENCOUNTER — Other Ambulatory Visit (HOSPITAL_BASED_OUTPATIENT_CLINIC_OR_DEPARTMENT_OTHER): Payer: Self-pay

## 2021-08-24 ENCOUNTER — Other Ambulatory Visit (HOSPITAL_BASED_OUTPATIENT_CLINIC_OR_DEPARTMENT_OTHER): Payer: Self-pay

## 2021-08-25 ENCOUNTER — Other Ambulatory Visit (HOSPITAL_BASED_OUTPATIENT_CLINIC_OR_DEPARTMENT_OTHER): Payer: Self-pay

## 2021-08-25 MED ORDER — CLONAZEPAM 0.5 MG PO TABS
ORAL_TABLET | ORAL | 0 refills | Status: DC
  Filled 2021-08-25: qty 10, 90d supply, fill #0

## 2021-08-30 ENCOUNTER — Other Ambulatory Visit (HOSPITAL_BASED_OUTPATIENT_CLINIC_OR_DEPARTMENT_OTHER): Payer: Self-pay

## 2021-09-02 ENCOUNTER — Other Ambulatory Visit (HOSPITAL_COMMUNITY): Payer: Self-pay

## 2021-09-02 ENCOUNTER — Other Ambulatory Visit (HOSPITAL_BASED_OUTPATIENT_CLINIC_OR_DEPARTMENT_OTHER): Payer: Self-pay

## 2021-09-02 DIAGNOSIS — Z808 Family history of malignant neoplasm of other organs or systems: Secondary | ICD-10-CM | POA: Diagnosis not present

## 2021-09-02 DIAGNOSIS — L309 Dermatitis, unspecified: Secondary | ICD-10-CM | POA: Diagnosis not present

## 2021-09-02 DIAGNOSIS — L814 Other melanin hyperpigmentation: Secondary | ICD-10-CM | POA: Diagnosis not present

## 2021-09-02 DIAGNOSIS — L821 Other seborrheic keratosis: Secondary | ICD-10-CM | POA: Diagnosis not present

## 2021-09-02 DIAGNOSIS — D225 Melanocytic nevi of trunk: Secondary | ICD-10-CM | POA: Diagnosis not present

## 2021-09-02 DIAGNOSIS — L578 Other skin changes due to chronic exposure to nonionizing radiation: Secondary | ICD-10-CM | POA: Diagnosis not present

## 2021-09-02 MED ORDER — TRIAMCINOLONE ACETONIDE 0.1 % EX CREA
TOPICAL_CREAM | CUTANEOUS | 1 refills | Status: DC
  Filled 2021-09-02 – 2021-09-06 (×2): qty 60, 14d supply, fill #0

## 2021-09-03 ENCOUNTER — Other Ambulatory Visit (HOSPITAL_COMMUNITY): Payer: Self-pay

## 2021-09-03 ENCOUNTER — Other Ambulatory Visit (HOSPITAL_BASED_OUTPATIENT_CLINIC_OR_DEPARTMENT_OTHER): Payer: Self-pay

## 2021-09-03 MED ORDER — TRIAMCINOLONE ACETONIDE 0.1 % EX CREA
1.0000 | TOPICAL_CREAM | Freq: Two times a day (BID) | CUTANEOUS | 1 refills | Status: AC
  Filled 2021-09-03: qty 45, 20d supply, fill #0
  Filled 2021-09-03: qty 60, 14d supply, fill #0
  Filled 2021-09-16 – 2022-05-30 (×2): qty 45, 20d supply, fill #1

## 2021-09-06 ENCOUNTER — Other Ambulatory Visit (HOSPITAL_BASED_OUTPATIENT_CLINIC_OR_DEPARTMENT_OTHER): Payer: Self-pay

## 2021-09-07 ENCOUNTER — Other Ambulatory Visit (HOSPITAL_BASED_OUTPATIENT_CLINIC_OR_DEPARTMENT_OTHER): Payer: Self-pay

## 2021-09-16 ENCOUNTER — Other Ambulatory Visit (HOSPITAL_BASED_OUTPATIENT_CLINIC_OR_DEPARTMENT_OTHER): Payer: Self-pay

## 2021-09-28 DIAGNOSIS — R7303 Prediabetes: Secondary | ICD-10-CM | POA: Diagnosis not present

## 2021-09-28 DIAGNOSIS — E559 Vitamin D deficiency, unspecified: Secondary | ICD-10-CM | POA: Diagnosis not present

## 2021-09-28 DIAGNOSIS — E538 Deficiency of other specified B group vitamins: Secondary | ICD-10-CM | POA: Diagnosis not present

## 2021-09-28 DIAGNOSIS — E785 Hyperlipidemia, unspecified: Secondary | ICD-10-CM | POA: Diagnosis not present

## 2021-09-28 DIAGNOSIS — R5382 Chronic fatigue, unspecified: Secondary | ICD-10-CM | POA: Diagnosis not present

## 2021-09-29 ENCOUNTER — Other Ambulatory Visit (HOSPITAL_BASED_OUTPATIENT_CLINIC_OR_DEPARTMENT_OTHER): Payer: Self-pay

## 2021-09-29 DIAGNOSIS — I10 Essential (primary) hypertension: Secondary | ICD-10-CM | POA: Diagnosis not present

## 2021-09-29 DIAGNOSIS — Z1211 Encounter for screening for malignant neoplasm of colon: Secondary | ICD-10-CM | POA: Diagnosis not present

## 2021-09-29 DIAGNOSIS — M7918 Myalgia, other site: Secondary | ICD-10-CM | POA: Diagnosis not present

## 2021-09-29 DIAGNOSIS — K635 Polyp of colon: Secondary | ICD-10-CM | POA: Diagnosis not present

## 2021-09-29 DIAGNOSIS — R400 Somnolence: Secondary | ICD-10-CM | POA: Diagnosis not present

## 2021-09-29 DIAGNOSIS — Z0000 Encounter for general adult medical examination without abnormal findings: Secondary | ICD-10-CM | POA: Diagnosis not present

## 2021-09-29 DIAGNOSIS — R7303 Prediabetes: Secondary | ICD-10-CM | POA: Diagnosis not present

## 2021-09-29 DIAGNOSIS — F330 Major depressive disorder, recurrent, mild: Secondary | ICD-10-CM | POA: Diagnosis not present

## 2021-09-29 DIAGNOSIS — E785 Hyperlipidemia, unspecified: Secondary | ICD-10-CM | POA: Diagnosis not present

## 2021-09-29 MED ORDER — ESCITALOPRAM OXALATE 10 MG PO TABS
ORAL_TABLET | ORAL | 3 refills | Status: DC
  Filled 2021-09-30 – 2021-10-01 (×2): qty 90, fill #0
  Filled 2021-10-04: qty 90, 90d supply, fill #0

## 2021-09-29 MED ORDER — CYCLOBENZAPRINE HCL 10 MG PO TABS
ORAL_TABLET | ORAL | 3 refills | Status: DC
  Filled 2021-09-29: qty 30, 30d supply, fill #0
  Filled 2021-11-11: qty 30, 30d supply, fill #1
  Filled 2022-09-04: qty 30, 30d supply, fill #2

## 2021-09-29 MED ORDER — LISINOPRIL-HYDROCHLOROTHIAZIDE 20-12.5 MG PO TABS
ORAL_TABLET | ORAL | 3 refills | Status: DC
  Filled 2021-09-30 – 2021-10-04 (×3): qty 90, fill #0
  Filled 2021-12-13: qty 90, 90d supply, fill #0
  Filled 2022-03-15: qty 90, 90d supply, fill #1
  Filled 2022-06-20: qty 90, 90d supply, fill #2
  Filled 2022-09-13 (×2): qty 30, 30d supply, fill #3

## 2021-09-29 MED ORDER — CLONAZEPAM 0.5 MG PO TABS
ORAL_TABLET | ORAL | 1 refills | Status: DC
  Filled 2021-09-29: qty 30, 30d supply, fill #0
  Filled 2021-11-11: qty 30, 30d supply, fill #1

## 2021-09-29 MED ORDER — ROSUVASTATIN CALCIUM 10 MG PO TABS
ORAL_TABLET | ORAL | 3 refills | Status: DC
  Filled 2021-09-29: qty 90, 90d supply, fill #0
  Filled 2021-09-30 – 2021-10-04 (×3): qty 90, fill #0
  Filled 2021-12-07: qty 90, 90d supply, fill #0
  Filled 2021-12-13 – 2022-03-04 (×3): qty 90, 90d supply, fill #1
  Filled 2022-05-29: qty 90, 90d supply, fill #2
  Filled 2022-09-11: qty 30, 30d supply, fill #3

## 2021-09-30 ENCOUNTER — Other Ambulatory Visit (HOSPITAL_BASED_OUTPATIENT_CLINIC_OR_DEPARTMENT_OTHER): Payer: Self-pay

## 2021-09-30 MED ORDER — ESCITALOPRAM OXALATE 10 MG PO TABS
ORAL_TABLET | ORAL | 3 refills | Status: DC
  Filled 2021-09-30: qty 90, 90d supply, fill #0

## 2021-10-01 ENCOUNTER — Other Ambulatory Visit (HOSPITAL_BASED_OUTPATIENT_CLINIC_OR_DEPARTMENT_OTHER): Payer: Self-pay

## 2021-10-04 ENCOUNTER — Other Ambulatory Visit (HOSPITAL_BASED_OUTPATIENT_CLINIC_OR_DEPARTMENT_OTHER): Payer: Self-pay

## 2021-10-05 ENCOUNTER — Other Ambulatory Visit (HOSPITAL_BASED_OUTPATIENT_CLINIC_OR_DEPARTMENT_OTHER): Payer: Self-pay

## 2021-10-06 ENCOUNTER — Other Ambulatory Visit (HOSPITAL_BASED_OUTPATIENT_CLINIC_OR_DEPARTMENT_OTHER): Payer: Self-pay

## 2021-10-07 ENCOUNTER — Other Ambulatory Visit (HOSPITAL_BASED_OUTPATIENT_CLINIC_OR_DEPARTMENT_OTHER): Payer: Self-pay

## 2021-11-10 ENCOUNTER — Encounter: Payer: Self-pay | Admitting: Gastroenterology

## 2021-11-11 ENCOUNTER — Other Ambulatory Visit (HOSPITAL_BASED_OUTPATIENT_CLINIC_OR_DEPARTMENT_OTHER): Payer: Self-pay

## 2021-11-22 ENCOUNTER — Other Ambulatory Visit (HOSPITAL_BASED_OUTPATIENT_CLINIC_OR_DEPARTMENT_OTHER): Payer: Self-pay

## 2021-12-07 ENCOUNTER — Encounter: Payer: Self-pay | Admitting: Gastroenterology

## 2021-12-07 ENCOUNTER — Other Ambulatory Visit (HOSPITAL_BASED_OUTPATIENT_CLINIC_OR_DEPARTMENT_OTHER): Payer: Self-pay

## 2021-12-13 ENCOUNTER — Other Ambulatory Visit (HOSPITAL_BASED_OUTPATIENT_CLINIC_OR_DEPARTMENT_OTHER): Payer: Self-pay

## 2021-12-28 DIAGNOSIS — E785 Hyperlipidemia, unspecified: Secondary | ICD-10-CM | POA: Diagnosis not present

## 2022-01-17 ENCOUNTER — Ambulatory Visit (AMBULATORY_SURGERY_CENTER): Payer: Self-pay | Admitting: *Deleted

## 2022-01-17 ENCOUNTER — Other Ambulatory Visit (HOSPITAL_BASED_OUTPATIENT_CLINIC_OR_DEPARTMENT_OTHER): Payer: Self-pay

## 2022-01-17 VITALS — Ht 66.0 in | Wt 205.4 lb

## 2022-01-17 DIAGNOSIS — Z86010 Personal history of colonic polyps: Secondary | ICD-10-CM

## 2022-01-17 MED ORDER — NA SULFATE-K SULFATE-MG SULF 17.5-3.13-1.6 GM/177ML PO SOLN
1.0000 | Freq: Once | ORAL | 0 refills | Status: AC
  Filled 2022-01-17 – 2022-02-03 (×2): qty 354, 1d supply, fill #0
  Filled 2022-02-03: qty 354, fill #0

## 2022-01-17 NOTE — Progress Notes (Signed)

## 2022-01-31 ENCOUNTER — Encounter: Payer: Self-pay | Admitting: Gastroenterology

## 2022-01-31 ENCOUNTER — Encounter: Payer: Self-pay | Admitting: Certified Registered Nurse Anesthetist

## 2022-02-02 ENCOUNTER — Other Ambulatory Visit (HOSPITAL_BASED_OUTPATIENT_CLINIC_OR_DEPARTMENT_OTHER): Payer: Self-pay

## 2022-02-02 ENCOUNTER — Encounter: Payer: Self-pay | Admitting: Gastroenterology

## 2022-02-03 ENCOUNTER — Other Ambulatory Visit (HOSPITAL_BASED_OUTPATIENT_CLINIC_OR_DEPARTMENT_OTHER): Payer: Self-pay

## 2022-02-03 MED ORDER — INFLUENZA VAC SPLIT QUAD 0.5 ML IM SUSY
PREFILLED_SYRINGE | INTRAMUSCULAR | 0 refills | Status: DC
  Filled 2022-02-03: qty 0.5, 1d supply, fill #0

## 2022-02-06 ENCOUNTER — Encounter: Payer: Self-pay | Admitting: Certified Registered Nurse Anesthetist

## 2022-02-07 ENCOUNTER — Encounter: Payer: Self-pay | Admitting: Gastroenterology

## 2022-02-07 ENCOUNTER — Ambulatory Visit (AMBULATORY_SURGERY_CENTER): Payer: 59 | Admitting: Gastroenterology

## 2022-02-07 VITALS — BP 158/88 | HR 76 | Temp 96.4°F | Resp 13 | Ht 66.0 in | Wt 205.4 lb

## 2022-02-07 DIAGNOSIS — I10 Essential (primary) hypertension: Secondary | ICD-10-CM | POA: Diagnosis not present

## 2022-02-07 DIAGNOSIS — D123 Benign neoplasm of transverse colon: Secondary | ICD-10-CM

## 2022-02-07 DIAGNOSIS — Z09 Encounter for follow-up examination after completed treatment for conditions other than malignant neoplasm: Secondary | ICD-10-CM

## 2022-02-07 DIAGNOSIS — K635 Polyp of colon: Secondary | ICD-10-CM | POA: Diagnosis not present

## 2022-02-07 DIAGNOSIS — Z1211 Encounter for screening for malignant neoplasm of colon: Secondary | ICD-10-CM | POA: Diagnosis not present

## 2022-02-07 DIAGNOSIS — E7800 Pure hypercholesterolemia, unspecified: Secondary | ICD-10-CM | POA: Diagnosis not present

## 2022-02-07 DIAGNOSIS — Z86010 Personal history of colonic polyps: Secondary | ICD-10-CM

## 2022-02-07 MED ORDER — SODIUM CHLORIDE 0.9 % IV SOLN: 500.0000 mL | Freq: Once | INTRAVENOUS | Status: DC

## 2022-02-07 NOTE — Op Note (Signed)
[place] Patient Name: Terry English Procedure Date: 02/07/2022 8:45 AM MRN: [id number] Endoscopist: Ladene Artist , MD, [id number] Age: 56 Referring MD:  Date of Birth: 1966-02-19 Gender: Female Account #: [id number] Procedure:                Colonoscopy Indications:              Surveillance: Personal history of adenomatous                            polyps on last colonoscopy 3 years ago Medicines:                Monitored Anesthesia Care Procedure:                Pre-Anesthesia Assessment:                           - Prior to the procedure, a History and Physical                            was performed, and patient medications and                            allergies were reviewed. The patient's tolerance of                            previous anesthesia was also reviewed. The risks                            and benefits of the procedure and the sedation                            options and risks were discussed with the patient.                            All questions were answered, and informed consent                            was obtained. Prior Anticoagulants: The patient has                            taken no anticoagulant or antiplatelet agents. ASA                            Grade Assessment: II - A patient with mild systemic                            disease. After reviewing the risks and benefits,                            the patient was deemed in satisfactory condition to                            undergo the procedure.  After obtaining informed consent, the colonoscope                            was passed under direct vision. Throughout the                            procedure, the patient's blood pressure, pulse, and                            oxygen saturations were monitored continuously. The                            Colonoscope was introduced through the anus and                            advanced to the the  cecum, identified by                            appendiceal orifice and ileocecal valve. The                            ileocecal valve, appendiceal orifice, and rectum                            were photographed. The quality of the bowel                            preparation was good. The colonoscopy was performed                            without difficulty. The patient tolerated the                            procedure well. Scope In: 8:56:49 AM Scope Out: 9:11:44 AM Scope Withdrawal Time: 0 hours 9 minutes 47 seconds  Total Procedure Duration: 0 hours 14 minutes 55 seconds  Findings:                 The perianal and digital rectal examinations were                            normal.                           A 7 mm polyp was found in the transverse colon. The                            polyp was sessile. The polyp was removed with a                            cold snare. Resection and retrieval were complete.                           The exam was otherwise without abnormality on  direct and retroflexion views. Complications:            No immediate complications. Estimated blood loss:                            None. Estimated Blood Loss:     Estimated blood loss: none. Impression:               - One 7 mm polyp in the transverse colon, removed                            with a cold snare. Resected and retrieved.                           - The examination was otherwise normal on direct                            and retroflexion views. Recommendation:           - Repeat colonoscopy date to be determined after                            pending pathology results are reviewed for                            surveillance based on pathology results.                           - Patient has a contact number available for                            emergencies. The signs and symptoms of potential                            delayed complications were discussed  with the                            patient. Return to normal activities tomorrow.                            Written discharge instructions were provided to the                            patient.                           - Resume previous diet.                           - Continue present medications.                           - Await pathology results. Ladene Artist, MD 02/07/2022 9:17:22 AM This report has been signed electronically.

## 2022-02-07 NOTE — Progress Notes (Signed)
Called to room to assist during endoscopic procedure.  Patient ID and intended procedure confirmed with present staff. Received instructions for my participation in the procedure from the performing physician.  

## 2022-02-07 NOTE — Progress Notes (Signed)
History & Physical  Primary Care Physician:  Maurice Small, MD Primary Gastroenterologist: Lucio Edward, MD  CHIEF COMPLAINT:  Personal history of colon polyps   HPI: Terry English is a 57 y.o. female with a personal history of a 12 mm tubular adenoma on colonoscopy in 2020 for surveillance colonoscopy.   Past Medical History:  Diagnosis Date   Allergy    SEASONAL   Anxiety    Cataract    BILATERAL,REMOVED   Depression    Hyperlipidemia    controlled   Hypertension    controlled    Past Surgical History:  Procedure Laterality Date   BLADDER SUSPENSION     BUNIONECTOMY     CATARACTS Bilateral    CESAREAN SECTION     x1   COLONOSCOPY     NASAL SEPTUM SURGERY     TONSILLECTOMY      Prior to Admission medications   Medication Sig Start Date End Date Taking? Authorizing Provider  escitalopram (LEXAPRO) 10 MG tablet 1 tablet orally Once a day 90 days 09/30/21  Yes   lisinopril-hydrochlorothiazide (ZESTORETIC) 20-12.5 MG tablet Take 1 tablet by mouth once a day 09/29/21  Yes   rosuvastatin (CRESTOR) 10 MG tablet 1 tablet Orally at bedtime 90 days 09/29/21  Yes   cholecalciferol (VITAMIN D3) 25 MCG (1000 UT) tablet Take 1,000 Units by mouth daily.    [provider]  clonazePAM (KLONOPIN) 0.5 MG tablet TAKE 1 TABLET BY MOUTH AT BEDTIME AS NEEDED 03/30/20 09/26/20  Maurice Small, MD  clonazePAM (KLONOPIN) 0.5 MG tablet 1/2 to 1 tablet Orally once a day if needed 30 days 09/29/21     cyclobenzaprine (FLEXERIL) 10 MG tablet 1 tablet at bedtime as needed Orally Once a day 30 day(s) 09/29/21     escitalopram (LEXAPRO) 10 MG tablet TAKE 1 TABLET BY MOUTH ONCE A DAY 08/29/19 08/31/20  Maurice Small, MD  escitalopram (LEXAPRO) 10 MG tablet Take 1.5 tablets (15 mg total) by mouth daily. 11/28/20     triamcinolone cream (KENALOG) 0.1 % Apply 1 Application topically 2 (two) times daily as needed for flares for 14 days 09/03/21       Current Outpatient Medications  Medication Sig Dispense  Refill   escitalopram (LEXAPRO) 10 MG tablet 1 tablet orally Once a day 90 days 90 tablet 3   lisinopril-hydrochlorothiazide (ZESTORETIC) 20-12.5 MG tablet Take 1 tablet by mouth once a day 90 tablet 3   rosuvastatin (CRESTOR) 10 MG tablet 1 tablet Orally at bedtime 90 days 90 tablet 3   cholecalciferol (VITAMIN D3) 25 MCG (1000 UT) tablet Take 1,000 Units by mouth daily.     clonazePAM (KLONOPIN) 0.5 MG tablet TAKE 1 TABLET BY MOUTH AT BEDTIME AS NEEDED 30 tablet 5   clonazePAM (KLONOPIN) 0.5 MG tablet 1/2 to 1 tablet Orally once a day if needed 30 days 30 tablet 1   cyclobenzaprine (FLEXERIL) 10 MG tablet 1 tablet at bedtime as needed Orally Once a day 30 day(s) 30 tablet 3   escitalopram (LEXAPRO) 10 MG tablet TAKE 1 TABLET BY MOUTH ONCE A DAY 90 tablet 3   escitalopram (LEXAPRO) 10 MG tablet Take 1.5 tablets (15 mg total) by mouth daily. 135 tablet 3   triamcinolone cream (KENALOG) 0.1 % Apply 1 Application topically 2 (two) times daily as needed for flares for 14 days 60 g 1   Current Facility-Administered Medications  Medication Dose Route Frequency Provider Last Rate Last Admin   0.9 %  sodium  chloride infusion  500 mL Intravenous Once Ladene Artist, MD        Allergies as of 02/07/2022 - Review Complete 02/07/2022  Allergen Reaction Noted   Imitrex [sumatriptan] Palpitations 10/09/2018    Family History  Problem Relation Age of Onset   Colon polyps Father    Breast cancer Neg Hx    Colon cancer Neg Hx    Esophageal cancer Neg Hx    Rectal cancer Neg Hx    Stomach cancer Neg Hx    Crohn's disease Neg Hx    Ulcerative colitis Neg Hx     Social History   Socioeconomic History   Marital status: Married    Spouse name: Not on file   Number of children: Not on file   Years of education: Not on file   Highest education level: Not on file  Occupational History   Not on file  Tobacco Use   Smoking status: Never    Passive exposure: Never   Smokeless tobacco: Never   Vaping Use   Vaping Use: Never used  Substance and Sexual Activity   Alcohol use: Not Currently   Drug use: Never   Sexual activity: Not on file  Other Topics Concern   Not on file  Social History Narrative   Not on file   Social Determinants of Health   Financial Resource Strain: Not on file  Food Insecurity: Not on file  Transportation Needs: Not on file  Physical Activity: Not on file  Stress: Not on file  Social Connections: Not on file  Intimate Partner Violence: Not on file    Review of Systems:  All systems reviewed were negative except where noted in HPI.   Physical Exam: General:  Alert, well-developed, in NAD Head:  Normocephalic and atraumatic. Eyes:  Sclera clear, no icterus.   Conjunctiva pink. Ears:  Normal auditory acuity. Mouth:  No deformity or lesions.  Neck:  Supple; no masses . Lungs:  Clear throughout to auscultation.   No wheezes, crackles, or rhonchi. No acute distress. Heart:  Regular rate and rhythm; no murmurs. Abdomen:  Soft, nondistended, nontender. No masses, hepatomegaly. No obvious masses.  Normal bowel .    Rectal:  Deferred   Msk:  Symmetrical without gross deformities.. Pulses:  Normal pulses noted. Extremities:  Without edema. Neurologic:  Alert and  oriented x4;  grossly normal neurologically. Skin:  Intact without significant lesions or rashes. Psych:  Alert and cooperative. Normal mood and affect.  Impression / Plan:   Personal history of a 12 mm tubular adenoma on colonoscopy in 2020 for surveillance colonoscopy.  Pricilla Riffle. Fuller Plan  02/07/2022, 8:48 AM See Shea Evans, [facility], to contact our on call provider

## 2022-02-07 NOTE — Progress Notes (Signed)
Report given to PACU, vss 

## 2022-02-07 NOTE — Patient Instructions (Signed)
Thank you for coming in to see [place] today! Resume your diet and medications today, Return to regular daily activities tomorrow. Pathology will be available in 1-2 weeks at which time next colonoscopy will be recommended.   YOU HAD AN ENDOSCOPIC PROCEDURE TODAY AT [facility]:   Refer to the procedure report that was given to you for any specific questions about what was found during the examination.  If the procedure report does not answer your questions, please call your gastroenterologist to clarify.  If you requested that your care partner not be given the details of your procedure findings, then the procedure report has been included in a sealed envelope for you to review at your convenience later.  YOU SHOULD EXPECT: Some feelings of bloating in the abdomen. Passage of more gas than usual.  Walking can help get rid of the air that was put into your GI tract during the procedure and reduce the bloating. If you had a lower endoscopy (such as a colonoscopy or flexible sigmoidoscopy) you may notice spotting of blood in your stool or on the toilet paper. If you underwent a bowel prep for your procedure, you may not have a normal bowel movement for a few days.  Please Note:  You might notice some irritation and congestion in your nose or some drainage.  This is from the oxygen used during your procedure.  There is no need for concern and it should clear up in a day or so.  SYMPTOMS TO REPORT IMMEDIATELY:  Following lower endoscopy (colonoscopy or flexible sigmoidoscopy):  Excessive amounts of blood in the stool  Significant tenderness or worsening of abdominal pains  Swelling of the abdomen that is new, acute  Fever of 100F or higher   For urgent or emergent issues, a gastroenterologist can be reached at any hour by calling [phone]. Do not use MyChart messaging for urgent concerns.    DIET:  We do recommend a small meal at first, but then you may proceed to your regular  diet.  Drink plenty of fluids but you should avoid alcoholic beverages for 24 hours.  ACTIVITY:  You should plan to take it easy for the rest of today and you should NOT DRIVE or use heavy machinery until tomorrow (because of the sedation medicines used during the test).    FOLLOW UP: Our staff will call the number listed on your records the next business day following your procedure.  We will call around 7:15- 8:00 am to check on you and address any questions or concerns that you may have regarding the information given to you following your procedure. If we do not reach you, we will leave a message.     If any biopsies were taken you will be contacted by phone or by letter within the next 1-3 weeks.  Please call us at [phone] if you have not heard about the biopsies in 3 weeks.    SIGNATURES/CONFIDENTIALITY: You and/or your care partner have signed paperwork which will be entered into your electronic medical record.  These signatures attest to the fact that that the information above on your After Visit Summary has been reviewed and is understood.  Full responsibility of the confidentiality of this discharge information lies with you and/or your care-partner.

## 2022-02-08 ENCOUNTER — Telehealth: Payer: Self-pay | Admitting: *Deleted

## 2022-02-08 NOTE — Telephone Encounter (Signed)
  Follow up Call-     02/07/2022    8:19 AM  Call back number  Post procedure Call Back phone  # [phone]  Permission to leave phone message Yes     Patient questions:  Do you have a fever, pain , or abdominal swelling? No. Pain Score  0 *  Have you tolerated food without any problems? Yes.    Have you been able to return to your normal activities? Yes.    Do you have any questions about your discharge instructions: Diet   No. Medications  No. Follow up visit  No.  Do you have questions or concerns about your Care? No.  Actions: * If pain score is 4 or above: No action needed, pain <4.

## 2022-02-14 ENCOUNTER — Other Ambulatory Visit (HOSPITAL_BASED_OUTPATIENT_CLINIC_OR_DEPARTMENT_OTHER): Payer: Self-pay

## 2022-02-14 MED ORDER — TETANUS-DIPHTH-ACELL PERTUSSIS 5-2.5-18.5 LF-MCG/0.5 IM SUSY
PREFILLED_SYRINGE | INTRAMUSCULAR | 0 refills | Status: DC
  Filled 2022-02-14: qty 0.5, 1d supply, fill #0

## 2022-02-15 ENCOUNTER — Other Ambulatory Visit (HOSPITAL_BASED_OUTPATIENT_CLINIC_OR_DEPARTMENT_OTHER): Payer: Self-pay

## 2022-02-24 ENCOUNTER — Encounter: Payer: Self-pay | Admitting: Gastroenterology

## 2022-03-04 ENCOUNTER — Ambulatory Visit (INDEPENDENT_AMBULATORY_CARE_PROVIDER_SITE_OTHER): Payer: Commercial Managed Care - PPO | Admitting: Internal Medicine

## 2022-03-04 ENCOUNTER — Encounter: Payer: Self-pay | Admitting: Internal Medicine

## 2022-03-04 ENCOUNTER — Other Ambulatory Visit (HOSPITAL_BASED_OUTPATIENT_CLINIC_OR_DEPARTMENT_OTHER): Payer: Self-pay

## 2022-03-04 VITALS — BP 138/100 | HR 81 | Temp 97.4°F | Ht 66.0 in | Wt 215.0 lb

## 2022-03-04 DIAGNOSIS — Z0000 Encounter for general adult medical examination without abnormal findings: Secondary | ICD-10-CM | POA: Insufficient documentation

## 2022-03-04 DIAGNOSIS — E669 Obesity, unspecified: Secondary | ICD-10-CM | POA: Diagnosis not present

## 2022-03-04 DIAGNOSIS — N951 Menopausal and female climacteric states: Secondary | ICD-10-CM | POA: Insufficient documentation

## 2022-03-04 DIAGNOSIS — I10 Essential (primary) hypertension: Secondary | ICD-10-CM | POA: Diagnosis not present

## 2022-03-04 DIAGNOSIS — R0683 Snoring: Secondary | ICD-10-CM | POA: Diagnosis not present

## 2022-03-04 DIAGNOSIS — E782 Mixed hyperlipidemia: Secondary | ICD-10-CM

## 2022-03-04 DIAGNOSIS — Z0001 Encounter for general adult medical examination with abnormal findings: Secondary | ICD-10-CM

## 2022-03-04 LAB — LIPID PANEL
Cholesterol: 200 mg/dL (ref 0–200)
HDL: 48.7 mg/dL (ref >39.00)
NonHDL: 151.18
Total CHOL/HDL Ratio: 4
Triglycerides: 208 mg/dL — ABNORMAL HIGH (ref 0.0–149.0)
VLDL: 41.6 mg/dL — ABNORMAL HIGH (ref 0.0–40.0)

## 2022-03-04 LAB — VITAMIN D 25 HYDROXY (VIT D DEFICIENCY, FRACTURES): VITD: 29.54 ng/mL — ABNORMAL LOW (ref 30.00–100.00)

## 2022-03-04 LAB — COMPREHENSIVE METABOLIC PANEL
ALT: 68 U/L — ABNORMAL HIGH (ref 0–35)
AST: 48 U/L — ABNORMAL HIGH (ref 0–37)
Albumin: 4.8 g/dL (ref 3.5–5.2)
Alkaline Phosphatase: 57 U/L (ref 39–117)
BUN: 17 mg/dL (ref 6–23)
CO2: 29 mEq/L (ref 19–32)
Calcium: 10 mg/dL (ref 8.4–10.5)
Chloride: 102 mEq/L (ref 96–112)
Creatinine, Ser: 0.76 mg/dL (ref 0.40–1.20)
GFR: 88.59 mL/min (ref >60.00)
Glucose, Bld: 129 mg/dL — ABNORMAL HIGH (ref 70–99)
Potassium: 4.2 mEq/L (ref 3.5–5.1)
Sodium: 139 mEq/L (ref 135–145)
Total Bilirubin: 0.6 mg/dL (ref 0.2–1.2)
Total Protein: 7.1 g/dL (ref 6.0–8.3)

## 2022-03-04 LAB — VITAMIN B12: Vitamin B-12: 378 pg/mL (ref 211–911)

## 2022-03-04 LAB — CBC
HCT: 41.4 % (ref 36.0–46.0)
Hemoglobin: 14.2 g/dL (ref 12.0–15.0)
MCHC: 34.3 g/dL (ref 30.0–36.0)
MCV: 92 fl (ref 78.0–100.0)
Platelets: 230 10*3/uL (ref 150.0–400.0)
RBC: 4.5 Mil/uL (ref 3.87–5.11)
RDW: 14.2 % (ref 11.5–15.5)
WBC: 5.7 10*3/uL (ref 4.0–10.5)

## 2022-03-04 LAB — TSH: TSH: 1.53 u[IU]/mL (ref 0.35–5.50)

## 2022-03-04 LAB — HEMOGLOBIN A1C: Hgb A1c MFr Bld: 6.5 % (ref 4.6–6.5)

## 2022-03-04 LAB — LDL CHOLESTEROL, DIRECT: Direct LDL: 132 mg/dL

## 2022-03-04 MED ORDER — SEMAGLUTIDE-WEIGHT MANAGEMENT 0.5 MG/0.5ML ~~LOC~~ SOAJ
0.5000 mg | SUBCUTANEOUS | 0 refills | Status: AC
  Filled 2022-03-04 – 2022-03-31 (×2): qty 2, 28d supply, fill #0

## 2022-03-04 MED ORDER — LEVONORGESTREL-ETHINYL ESTRAD 90-20 MCG PO TABS
1.0000 | ORAL_TABLET | Freq: Every day | ORAL | 3 refills | Status: DC
  Filled 2022-03-04: qty 84, 84d supply, fill #0

## 2022-03-04 MED ORDER — SEMAGLUTIDE-WEIGHT MANAGEMENT 1.7 MG/0.75ML ~~LOC~~ SOAJ
1.7000 mg | SUBCUTANEOUS | 0 refills | Status: AC
  Filled 2022-03-04 – 2022-06-15 (×3): qty 3, 28d supply, fill #0

## 2022-03-04 MED ORDER — SEMAGLUTIDE-WEIGHT MANAGEMENT 2.4 MG/0.75ML ~~LOC~~ SOAJ
2.4000 mg | SUBCUTANEOUS | 0 refills | Status: AC
  Filled 2022-03-04 – 2022-06-29 (×3): qty 3, 28d supply, fill #0

## 2022-03-04 MED ORDER — SEMAGLUTIDE-WEIGHT MANAGEMENT 1 MG/0.5ML ~~LOC~~ SOAJ
1.0000 mg | SUBCUTANEOUS | 0 refills | Status: AC
  Filled 2022-03-04 – 2022-05-23 (×5): qty 2, 28d supply, fill #0

## 2022-03-04 MED ORDER — SEMAGLUTIDE-WEIGHT MANAGEMENT 0.25 MG/0.5ML ~~LOC~~ SOAJ
0.2500 mg | SUBCUTANEOUS | 0 refills | Status: AC
  Filled 2022-03-04 – 2022-03-31 (×2): qty 2, 28d supply, fill #0

## 2022-03-04 NOTE — Assessment & Plan Note (Signed)
Rx low dose hormone treatment as she is having symptoms that are bothersome.

## 2022-03-04 NOTE — Assessment & Plan Note (Signed)
Checking home sleep test to rule out OSA for long term snoring and daytime sleepiness.

## 2022-03-04 NOTE — Assessment & Plan Note (Signed)
BP is moderately high today. She has checked at home and normal regularly. Continue regimen for now and working on weight loss to help. Continue lisinopril/hctz 20/12.5 mg daily. Checking CMP and CBC and adjust as needed.

## 2022-03-04 NOTE — Assessment & Plan Note (Signed)
Checking for complications with vitamin D, TSH, B12, HgA1c. She is trying to lose weight. Rx wegovy to help with weight loss goals. She is a good candidate and has several complications including hyperlipidemia and hypertension.

## 2022-03-04 NOTE — Progress Notes (Signed)
   Subjective:   Patient ID: Terry English, female    DOB: 07/03/1965, 57 y.o.   MRN: [id number]  HPI The patient is a new 57 YO female coming in for concerns.   Also desires physical.  PMH, [place], social history reviewed and updated  Review of Systems  Constitutional:  Positive for unexpected weight change.  HENT: Negative.    Eyes: Negative.   Respiratory:  Negative for cough, chest tightness and shortness of breath.   Cardiovascular:  Negative for chest pain, palpitations and leg swelling.  Gastrointestinal:  Negative for abdominal distention, abdominal pain, constipation, diarrhea, nausea and vomiting.  Musculoskeletal: Negative.   Skin: Negative.   Neurological: Negative.   Psychiatric/Behavioral:  Positive for dysphoric mood and sleep disturbance.     Objective:  Physical Exam Constitutional:      Appearance: She is well-developed.  HENT:     Head: Normocephalic and atraumatic.  Cardiovascular:     Rate and Rhythm: Normal rate and regular rhythm.  Pulmonary:     Effort: Pulmonary effort is normal. No respiratory distress.     Breath sounds: Normal breath sounds. No wheezing or rales.  Abdominal:     General: Bowel sounds are normal. There is no distension.     Palpations: Abdomen is soft.     Tenderness: There is no abdominal tenderness. There is no rebound.  Musculoskeletal:     Cervical back: Normal range of motion.  Skin:    General: Skin is warm and dry.  Neurological:     Mental Status: She is alert and oriented to person, place, and time.     Coordination: Coordination normal.     Vitals:   03/04/22 0942 03/04/22 0948 03/04/22 1043  BP: (!) 140/100 (!) 140/100 (!) 138/100  Pulse: 81    Temp: (!) 97.4 F (36.3 C)    TempSrc: Oral    SpO2: 97%    Weight: 215 lb (97.5 kg)    Height: '5\' 6"'$  (1.676 m)      Assessment & Plan:

## 2022-03-04 NOTE — Assessment & Plan Note (Signed)
Checking lipid panel and adjust crestor 10 mg daily as needed. 

## 2022-03-04 NOTE — Assessment & Plan Note (Signed)
Flu shot up to date. Covid-19 counseled. Shingrix getting records. Tetanus getting records. Colonoscopy up to date. Mammogram up to date, pap smear up to date. Counseled about sun safety and mole surveillance. Counseled about the dangers of distracted driving. Given 10 year screening recommendations.

## 2022-03-04 NOTE — Patient Instructions (Signed)
We have sent in the hormone medicine to take 1 pill daily.  We have sent in the wegovy to take 0.25 mg weekly for month 1, then 0.5 mg weekly for month 2, then 1 mg weekly for month 3, then 1.7 mg weekly for month 4 then 2.4 mg weekly ongoing.

## 2022-03-07 ENCOUNTER — Other Ambulatory Visit (HOSPITAL_BASED_OUTPATIENT_CLINIC_OR_DEPARTMENT_OTHER): Payer: Self-pay

## 2022-03-08 ENCOUNTER — Encounter: Payer: Self-pay | Admitting: Internal Medicine

## 2022-03-09 ENCOUNTER — Telehealth: Payer: Self-pay

## 2022-03-09 NOTE — Telephone Encounter (Signed)
Left voicemail for patient to let her know that we will need for her to schedule a follow up in 3 months to recheck her A1C levels and to also let us know if she would like to speak with a nutritionist.

## 2022-03-11 NOTE — Telephone Encounter (Signed)
Patient Advocate Encounter   Received notification from [employer] that prior authorization for [facility] 0.'25MG'$ /0.5ML is required.   PA submitted on 03/11/2022 Key [id number] Status is pending

## 2022-03-16 ENCOUNTER — Other Ambulatory Visit (HOSPITAL_BASED_OUTPATIENT_CLINIC_OR_DEPARTMENT_OTHER): Payer: Self-pay

## 2022-03-16 DIAGNOSIS — Z124 Encounter for screening for malignant neoplasm of cervix: Secondary | ICD-10-CM | POA: Diagnosis not present

## 2022-03-16 DIAGNOSIS — Z1151 Encounter for screening for human papillomavirus (HPV): Secondary | ICD-10-CM | POA: Diagnosis not present

## 2022-03-16 DIAGNOSIS — Z6834 Body mass index (BMI) 34.0-34.9, adult: Secondary | ICD-10-CM | POA: Diagnosis not present

## 2022-03-16 DIAGNOSIS — Z1231 Encounter for screening mammogram for malignant neoplasm of breast: Secondary | ICD-10-CM | POA: Diagnosis not present

## 2022-03-16 DIAGNOSIS — Z01419 Encounter for gynecological examination (general) (routine) without abnormal findings: Secondary | ICD-10-CM | POA: Diagnosis not present

## 2022-03-16 MED ORDER — ESTRADIOL 0.05 MG/24HR TD PTTW
1.0000 | MEDICATED_PATCH | TRANSDERMAL | 3 refills | Status: DC
  Filled 2022-03-16: qty 24, 84d supply, fill #0
  Filled 2022-05-30: qty 24, 84d supply, fill #1
  Filled 2022-08-24: qty 8, 28d supply, fill #2
  Filled 2022-09-21: qty 8, 28d supply, fill #3
  Filled 2022-10-17: qty 8, 28d supply, fill #4
  Filled 2022-11-14: qty 8, 28d supply, fill #5
  Filled 2022-12-12: qty 8, 28d supply, fill #6
  Filled 2023-01-06: qty 8, 28d supply, fill #7

## 2022-03-16 MED ORDER — PROGESTERONE MICRONIZED 100 MG PO CAPS
200.0000 mg | ORAL_CAPSULE | Freq: Every day | ORAL | 3 refills | Status: DC
  Filled 2022-03-16: qty 24, 12d supply, fill #0
  Filled 2022-03-21 – 2022-03-22 (×2): qty 24, 12d supply, fill #1

## 2022-03-17 ENCOUNTER — Encounter: Payer: Self-pay | Admitting: Internal Medicine

## 2022-03-17 ENCOUNTER — Other Ambulatory Visit (HOSPITAL_BASED_OUTPATIENT_CLINIC_OR_DEPARTMENT_OTHER): Payer: Self-pay

## 2022-03-17 LAB — HM PAP SMEAR: HM Pap smear: NEGATIVE

## 2022-03-21 ENCOUNTER — Other Ambulatory Visit (HOSPITAL_BASED_OUTPATIENT_CLINIC_OR_DEPARTMENT_OTHER): Payer: Self-pay

## 2022-03-22 ENCOUNTER — Other Ambulatory Visit (HOSPITAL_BASED_OUTPATIENT_CLINIC_OR_DEPARTMENT_OTHER): Payer: Self-pay

## 2022-03-22 MED ORDER — PROGESTERONE MICRONIZED 100 MG PO CAPS
100.0000 mg | ORAL_CAPSULE | Freq: Every day | ORAL | 3 refills | Status: DC
  Filled 2022-03-22 – 2022-04-05 (×2): qty 90, 90d supply, fill #0
  Filled 2022-07-02: qty 90, 90d supply, fill #1
  Filled 2022-10-14: qty 30, 30d supply, fill #2
  Filled 2022-11-11: qty 30, 30d supply, fill #3
  Filled 2023-01-05: qty 30, 30d supply, fill #4
  Filled 2023-02-18: qty 30, 30d supply, fill #5

## 2022-03-23 ENCOUNTER — Other Ambulatory Visit (HOSPITAL_BASED_OUTPATIENT_CLINIC_OR_DEPARTMENT_OTHER): Payer: Self-pay

## 2022-03-28 ENCOUNTER — Other Ambulatory Visit (HOSPITAL_BASED_OUTPATIENT_CLINIC_OR_DEPARTMENT_OTHER): Payer: Self-pay

## 2022-03-31 ENCOUNTER — Other Ambulatory Visit (HOSPITAL_BASED_OUTPATIENT_CLINIC_OR_DEPARTMENT_OTHER): Payer: Self-pay

## 2022-04-04 ENCOUNTER — Ambulatory Visit (HOSPITAL_BASED_OUTPATIENT_CLINIC_OR_DEPARTMENT_OTHER): Payer: Commercial Managed Care - PPO | Admitting: Internal Medicine

## 2022-04-04 ENCOUNTER — Other Ambulatory Visit (HOSPITAL_BASED_OUTPATIENT_CLINIC_OR_DEPARTMENT_OTHER): Payer: Self-pay

## 2022-04-04 DIAGNOSIS — R0683 Snoring: Secondary | ICD-10-CM

## 2022-04-05 ENCOUNTER — Other Ambulatory Visit (HOSPITAL_BASED_OUTPATIENT_CLINIC_OR_DEPARTMENT_OTHER): Payer: Self-pay

## 2022-04-06 ENCOUNTER — Ambulatory Visit (HOSPITAL_BASED_OUTPATIENT_CLINIC_OR_DEPARTMENT_OTHER): Payer: Commercial Managed Care - PPO | Attending: Internal Medicine | Admitting: Internal Medicine

## 2022-04-07 ENCOUNTER — Ambulatory Visit (HOSPITAL_BASED_OUTPATIENT_CLINIC_OR_DEPARTMENT_OTHER): Payer: Commercial Managed Care - PPO | Attending: Internal Medicine | Admitting: Internal Medicine

## 2022-04-07 VITALS — Ht 65.0 in | Wt 205.0 lb

## 2022-04-07 DIAGNOSIS — G4733 Obstructive sleep apnea (adult) (pediatric): Secondary | ICD-10-CM | POA: Diagnosis not present

## 2022-04-07 DIAGNOSIS — R0683 Snoring: Secondary | ICD-10-CM | POA: Insufficient documentation

## 2022-04-08 ENCOUNTER — Other Ambulatory Visit (HOSPITAL_BASED_OUTPATIENT_CLINIC_OR_DEPARTMENT_OTHER): Payer: Self-pay

## 2022-04-11 ENCOUNTER — Other Ambulatory Visit: Payer: Self-pay

## 2022-04-14 ENCOUNTER — Other Ambulatory Visit (HOSPITAL_COMMUNITY): Payer: Self-pay

## 2022-04-14 DIAGNOSIS — R0683 Snoring: Secondary | ICD-10-CM

## 2022-04-14 NOTE — Procedures (Signed)
    Patient Name: Terry English, [place] Date: 04/07/2022 Gender: Female D.O.B: 1965/03/19 Age (years): 56 Referring Provider: Pricilla Holm Height (inches): 65 Interpreting Physician: Baird Lyons MD, ABSM Weight (lbs): 205 RPSGT: Gerhard Perches BMI: 34 MRN: [id number] Neck Size: 14.00  CLINICAL INFORMATION Sleep Study Type: HST Indication for sleep study: Excessive somnolence Epworth Sleepiness Score: 14  SLEEP STUDY TECHNIQUE A multi-channel overnight portable sleep study was performed. The channels recorded were: nasal airflow, thoracic respiratory movement, and oxygen saturation with a pulse oximetry. Snoring was also monitored.  MEDICATIONS Patient self administered medications include: none reported.  SLEEP ARCHITECTURE Patient was studied for 362.5 minutes. The sleep efficiency was 100.0 % and the patient was supine for 0%. The arousal index was 0.0 per hour.  RESPIRATORY PARAMETERS The overall AHI was 21.8 per hour, with a central apnea index of 0 per hour. The oxygen nadir was 81% during sleep.  CARDIAC DATA Mean heart rate during sleep was 74.5 bpm.  IMPRESSIONS - Moderate obstructive sleep apnea occurred during this study (AHI = 21.8/h). - Moderate oxygen desaturation was noted during this study (Min O2 = 81%, Mean 95%). - Patient snored.  DIAGNOSIS - Obstructive Sleep Apnea (G47.33)  RECOMMENDATIONS - Suggest CPAP titration sleep study or autopap. Other options would be based on clinical judgment. - Be careful with alcohol, sedatives and other CNS depressants that may worsen sleep apnea and disrupt normal sleep architecture. - Sleep hygiene should be reviewed to assess factors that may improve sleep quality. - Weight management and regular exercise should be initiated or continued.  [Electronically signed] 04/14/2022 12:58 PM  Baird Lyons MD, [place], American Board of Sleep Medicine NPI: [id number]                          [place], [place]  ELECTRONICALLY SIGNED ON:  04/14/2022, 12:56 PM [place] PH: [phone]   FX: [phone] [employer]

## 2022-04-27 DIAGNOSIS — Z6835 Body mass index (BMI) 35.0-35.9, adult: Secondary | ICD-10-CM | POA: Diagnosis not present

## 2022-04-27 DIAGNOSIS — Z713 Dietary counseling and surveillance: Secondary | ICD-10-CM | POA: Diagnosis not present

## 2022-05-01 ENCOUNTER — Other Ambulatory Visit (HOSPITAL_BASED_OUTPATIENT_CLINIC_OR_DEPARTMENT_OTHER): Payer: Self-pay

## 2022-05-02 ENCOUNTER — Other Ambulatory Visit: Payer: Self-pay

## 2022-05-03 ENCOUNTER — Other Ambulatory Visit (HOSPITAL_BASED_OUTPATIENT_CLINIC_OR_DEPARTMENT_OTHER): Payer: Self-pay

## 2022-05-04 ENCOUNTER — Other Ambulatory Visit (HOSPITAL_BASED_OUTPATIENT_CLINIC_OR_DEPARTMENT_OTHER): Payer: Self-pay

## 2022-05-09 ENCOUNTER — Ambulatory Visit: Payer: 59 | Admitting: Internal Medicine

## 2022-05-13 ENCOUNTER — Other Ambulatory Visit: Payer: Self-pay

## 2022-05-13 ENCOUNTER — Other Ambulatory Visit (HOSPITAL_BASED_OUTPATIENT_CLINIC_OR_DEPARTMENT_OTHER): Payer: Self-pay

## 2022-05-16 ENCOUNTER — Other Ambulatory Visit (HOSPITAL_BASED_OUTPATIENT_CLINIC_OR_DEPARTMENT_OTHER): Payer: Self-pay

## 2022-05-23 ENCOUNTER — Other Ambulatory Visit (HOSPITAL_BASED_OUTPATIENT_CLINIC_OR_DEPARTMENT_OTHER): Payer: Self-pay

## 2022-05-27 ENCOUNTER — Other Ambulatory Visit (HOSPITAL_BASED_OUTPATIENT_CLINIC_OR_DEPARTMENT_OTHER): Payer: Self-pay

## 2022-05-30 ENCOUNTER — Other Ambulatory Visit: Payer: Self-pay

## 2022-05-30 ENCOUNTER — Other Ambulatory Visit (HOSPITAL_BASED_OUTPATIENT_CLINIC_OR_DEPARTMENT_OTHER): Payer: Self-pay

## 2022-05-31 ENCOUNTER — Encounter: Payer: Self-pay | Admitting: Internal Medicine

## 2022-05-31 ENCOUNTER — Ambulatory Visit: Payer: Commercial Managed Care - PPO | Admitting: Internal Medicine

## 2022-05-31 VITALS — BP 110/77 | HR 87 | Temp 98.5°F | Ht 65.0 in | Wt 211.0 lb

## 2022-05-31 DIAGNOSIS — R7303 Prediabetes: Secondary | ICD-10-CM

## 2022-05-31 DIAGNOSIS — E669 Obesity, unspecified: Secondary | ICD-10-CM | POA: Diagnosis not present

## 2022-05-31 DIAGNOSIS — E66811 Obesity, class 1: Secondary | ICD-10-CM

## 2022-05-31 LAB — POCT GLYCOSYLATED HEMOGLOBIN (HGB A1C): HbA1c POC (<> result, manual entry): 5.8 % (ref 4.0–5.6)

## 2022-05-31 NOTE — Progress Notes (Signed)
   Subjective:   Patient ID: Terry English, female    DOB: 1966/01/20, 57 y.o.   MRN: [id number]  HPI The patient is a 57 YO female coming in for follow up pre-diabetes and weight.  Review of Systems  Constitutional: Negative.   HENT: Negative.    Eyes: Negative.   Respiratory:  Negative for cough, chest tightness and shortness of breath.   Cardiovascular:  Negative for chest pain, palpitations and leg swelling.  Gastrointestinal:  Negative for abdominal distention, abdominal pain, constipation, diarrhea, nausea and vomiting.  Musculoskeletal: Negative.   Skin: Negative.   Neurological: Negative.   Psychiatric/Behavioral: Negative.      Objective:  Physical Exam Constitutional:      Appearance: She is well-developed.  HENT:     Head: Normocephalic and atraumatic.  Cardiovascular:     Rate and Rhythm: Normal rate and regular rhythm.  Pulmonary:     Effort: Pulmonary effort is normal. No respiratory distress.     Breath sounds: Normal breath sounds. No wheezing or rales.  Abdominal:     General: Bowel sounds are normal. There is no distension.     Palpations: Abdomen is soft.     Tenderness: There is no abdominal tenderness. There is no rebound.  Musculoskeletal:     Cervical back: Normal range of motion.  Skin:    General: Skin is warm and dry.  Neurological:     Mental Status: She is alert and oriented to person, place, and time.     Coordination: Coordination normal.     Vitals:   05/31/22 0901  BP: 110/77  Pulse: 87  Temp: 98.5 F (36.9 C)  TempSrc: Oral  SpO2: 96%  Weight: 211 lb (95.7 kg)  Height: 5\' 5"  (1.651 m)    Assessment & Plan:

## 2022-05-31 NOTE — Addendum Note (Signed)
Addended by: Macie Burows on: 05/31/2022 09:54 AM   Modules accepted: Orders

## 2022-05-31 NOTE — Patient Instructions (Signed)
We will see you back in 6 months let us know if we need to prescribe the zepbound.

## 2022-05-31 NOTE — Assessment & Plan Note (Signed)
POC HgA1c done today and 5.8 which is improved from prior. She is down 4 pounds and working with a nutritionist. Follow up 6 months for HgA1c.

## 2022-05-31 NOTE — Assessment & Plan Note (Signed)
Weight is down 4 pounds on wegovy. She is taking last dose 0.5 mg weekly today and then will start 1 mg weekly. May need to switch to zepbound due to cost as insurance will stop coverage within 1 month.

## 2022-06-06 ENCOUNTER — Other Ambulatory Visit (HOSPITAL_BASED_OUTPATIENT_CLINIC_OR_DEPARTMENT_OTHER): Payer: Self-pay

## 2022-06-07 ENCOUNTER — Other Ambulatory Visit (HOSPITAL_BASED_OUTPATIENT_CLINIC_OR_DEPARTMENT_OTHER): Payer: Self-pay

## 2022-06-10 ENCOUNTER — Other Ambulatory Visit (HOSPITAL_BASED_OUTPATIENT_CLINIC_OR_DEPARTMENT_OTHER): Payer: Self-pay

## 2022-06-15 ENCOUNTER — Other Ambulatory Visit (HOSPITAL_BASED_OUTPATIENT_CLINIC_OR_DEPARTMENT_OTHER): Payer: Self-pay

## 2022-06-20 ENCOUNTER — Other Ambulatory Visit (HOSPITAL_BASED_OUTPATIENT_CLINIC_OR_DEPARTMENT_OTHER): Payer: Self-pay

## 2022-06-28 ENCOUNTER — Other Ambulatory Visit: Payer: Self-pay

## 2022-06-29 ENCOUNTER — Other Ambulatory Visit (HOSPITAL_BASED_OUTPATIENT_CLINIC_OR_DEPARTMENT_OTHER): Payer: Self-pay

## 2022-07-04 ENCOUNTER — Ambulatory Visit: Payer: Commercial Managed Care - PPO | Admitting: Internal Medicine

## 2022-07-23 IMAGING — US US BREAST*L* LIMITED INC AXILLA
1 series · 4 of 4 positions shown · non-contrast
Comparison: Previous exam(s).

CLINICAL DATA: 55-year-old female with intermittent, lateral left
breast pain for the last several weeks.

EXAM:
DIGITAL DIAGNOSTIC BILATERAL MAMMOGRAM WITH TOMOSYNTHESIS AND CAD;
ULTRASOUND LEFT BREAST LIMITED
TECHNIQUE: Bilateral digital diagnostic mammography and breast tomosynthesis
was performed. The images were evaluated with computer-aided
detection.; Targeted ultrasound examination of the left breast was
performed.

[Series 1: us breast*left* limited inc axilla · 0.06mm/px · 4 of 4 slices shown]
[im 1/4]
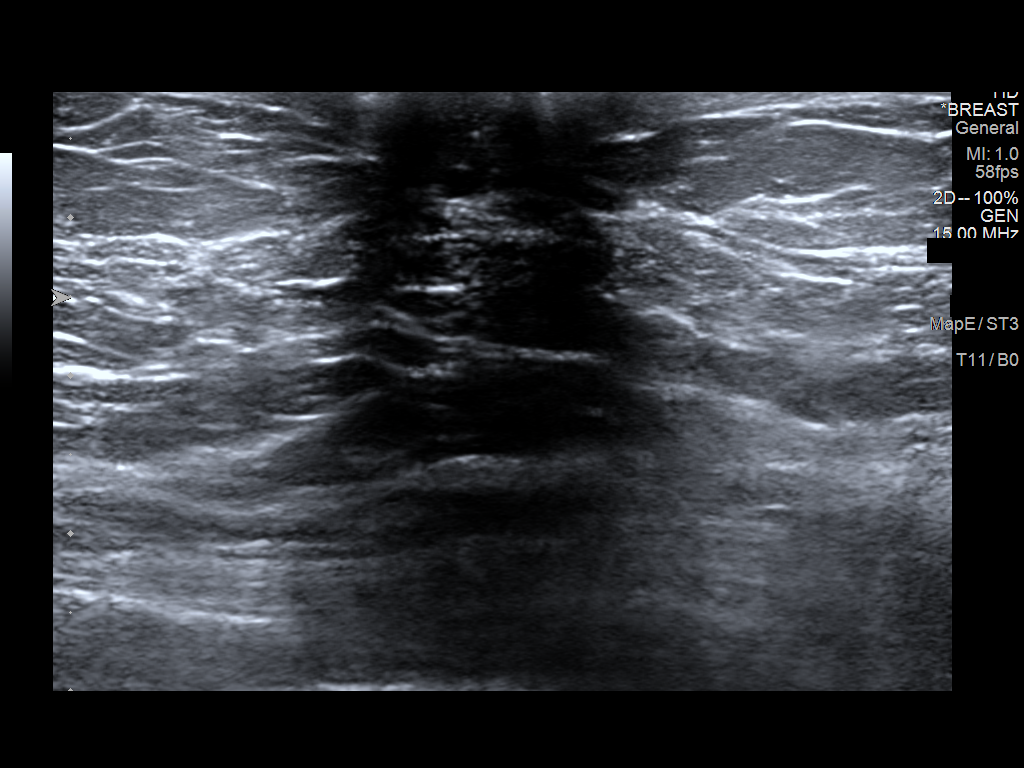
[im 2/4]
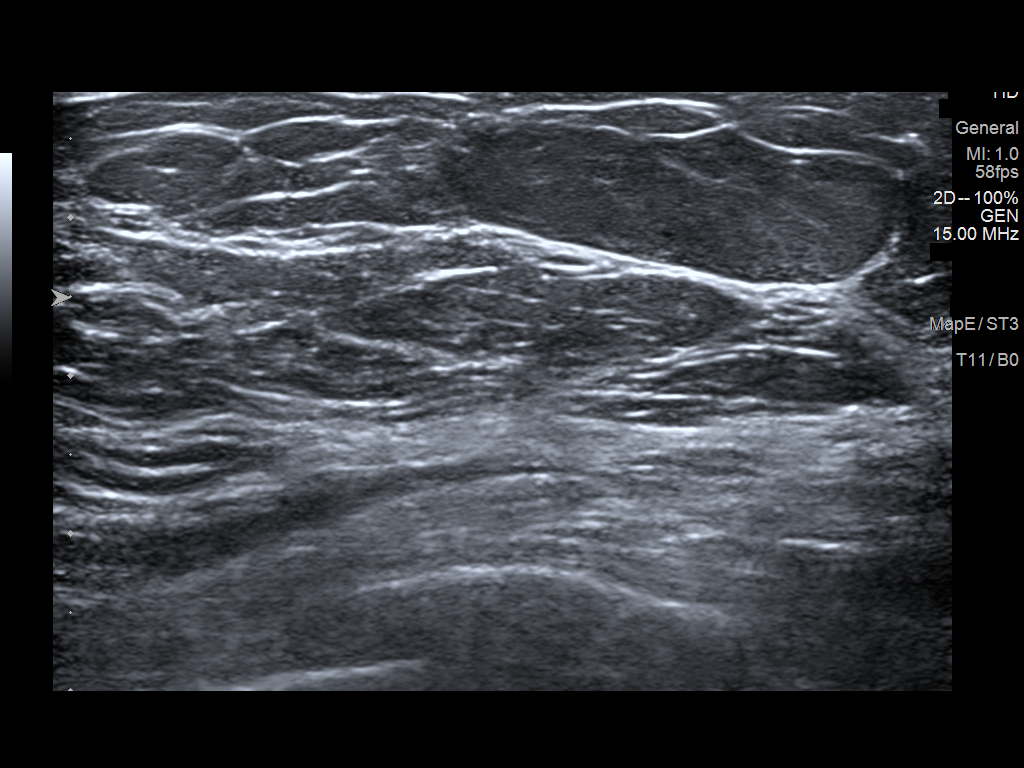
[im 3/4]
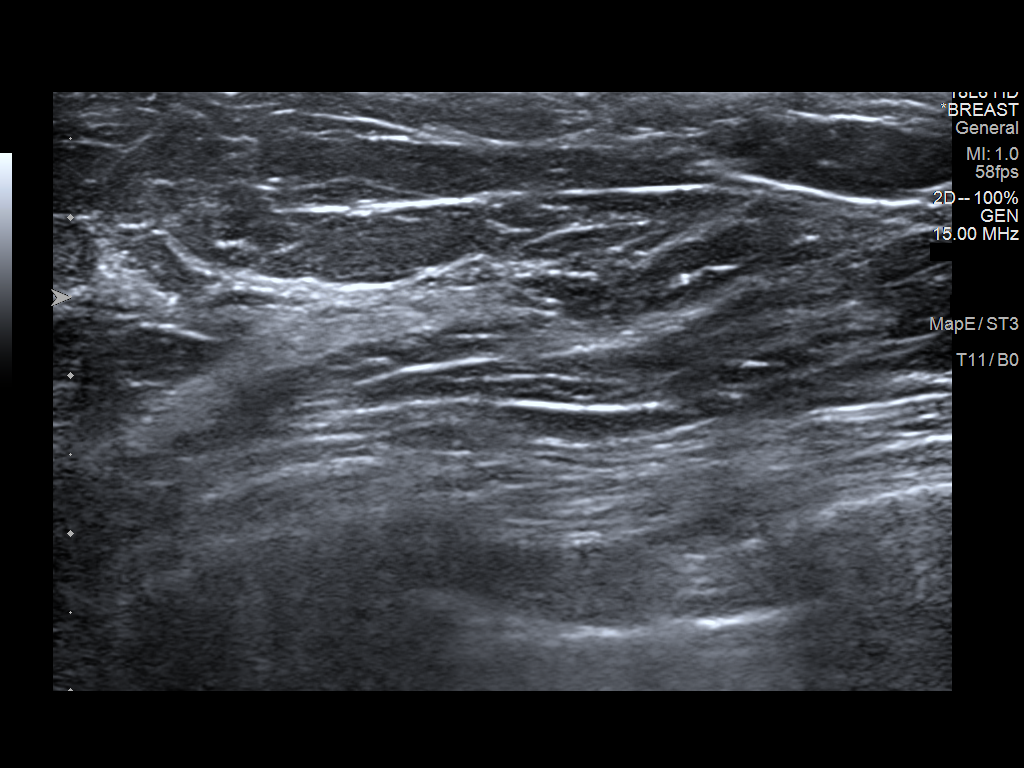
[im 4/4]
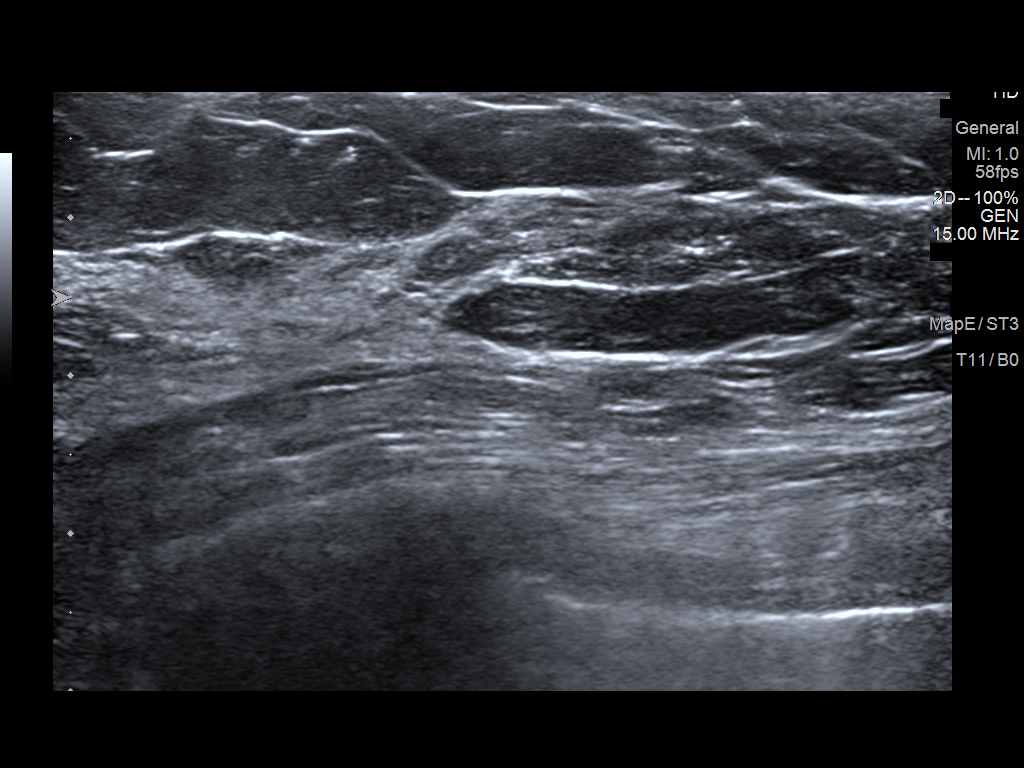

[4 of 4 positions shown; findings below may reference images not displayed]

ACR Breast Density Category b: There are scattered areas of
fibroglandular density.
FINDINGS: No focal or suspicious mammographic findings are identified in
either breast. The parenchymal pattern is stable.

Targeted ultrasound is performed, showing no focal or suspicious
sonographic findings in the lateral left breast.
IMPRESSION: 1. No mammographic evidence of malignancy in either breast.
2. Unremarkable sonographic evaluation of the left breast.

RECOMMENDATION:
1. Clinical follow-up recommended for the painful area of concern in
the left breast. Any further workup should be based on clinical
grounds.
2.  Screening mammogram in one year.(Code:OW-U-1R1)

I have discussed the findings and recommendations with the patient.
If applicable, a reminder letter will be sent to the patient
regarding the next appointment.

BI-RADS CATEGORY  1: Negative.

## 2022-07-23 IMAGING — MG DIGITAL DIAGNOSTIC BILAT W/ TOMO W/ CAD
8 series · 8 of 24 positions shown · non-contrast
Comparison: Previous exam(s).

CLINICAL DATA: 55-year-old female with intermittent, lateral left
breast pain for the last several weeks.

EXAM:
DIGITAL DIAGNOSTIC BILATERAL MAMMOGRAM WITH TOMOSYNTHESIS AND CAD;
ULTRASOUND LEFT BREAST LIMITED
TECHNIQUE: Bilateral digital diagnostic mammography and breast tomosynthesis
was performed. The images were evaluated with computer-aided
detection.; Targeted ultrasound examination of the left breast was
performed.

[R CC synth-2D]
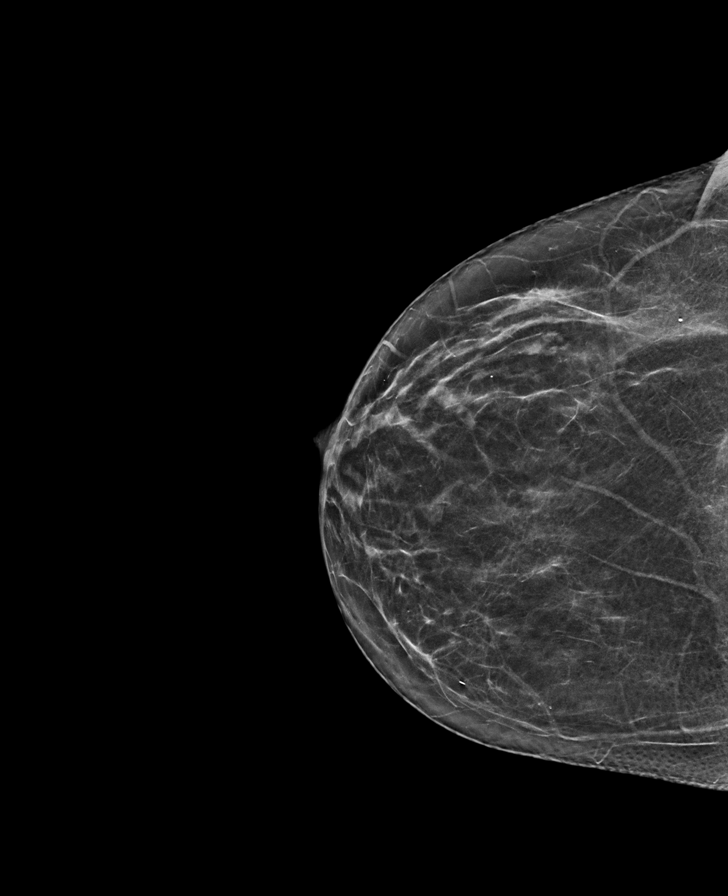

[L CC synth-2D]
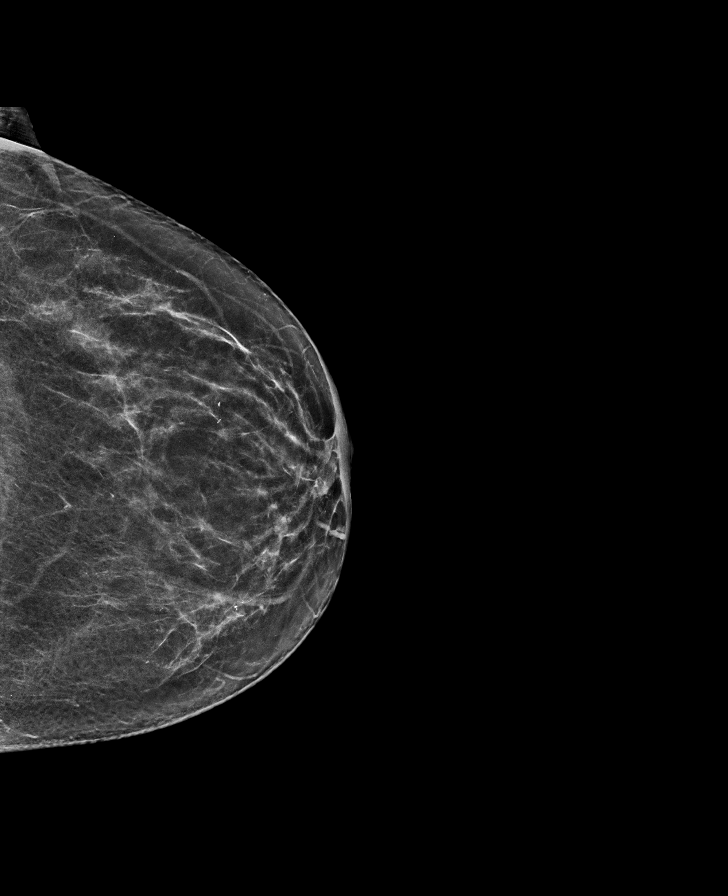

[R MLO synth-2D]
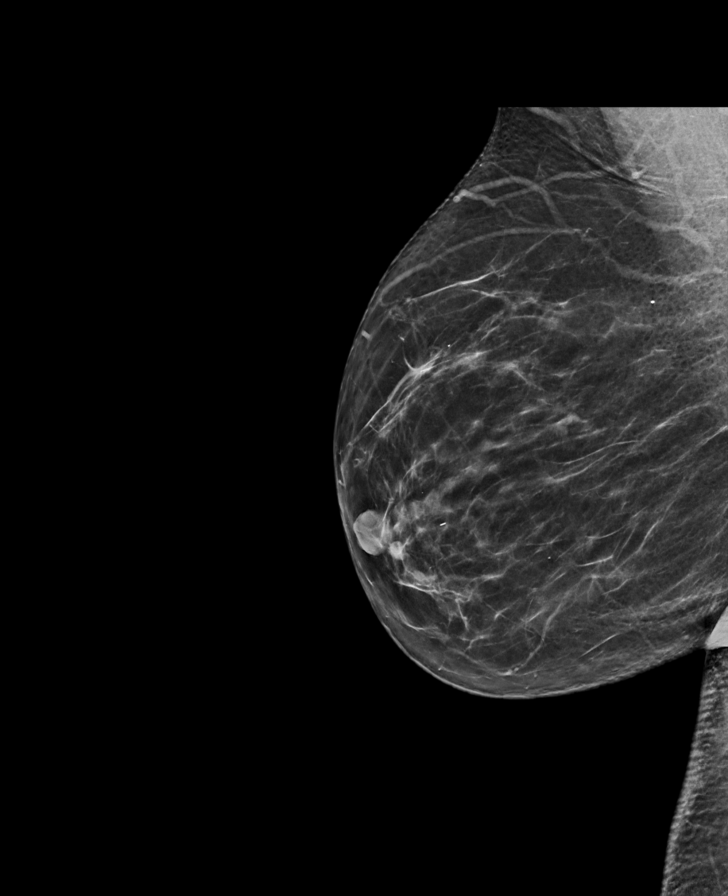

[L MLO synth-2D]
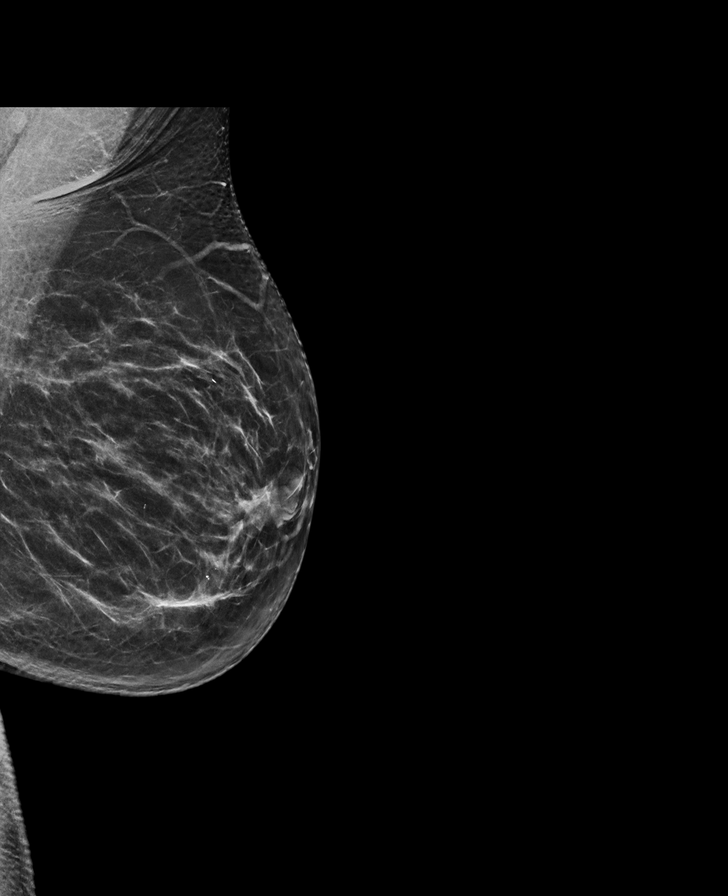

[L MLO tomo · tomo slice 35/70.0]
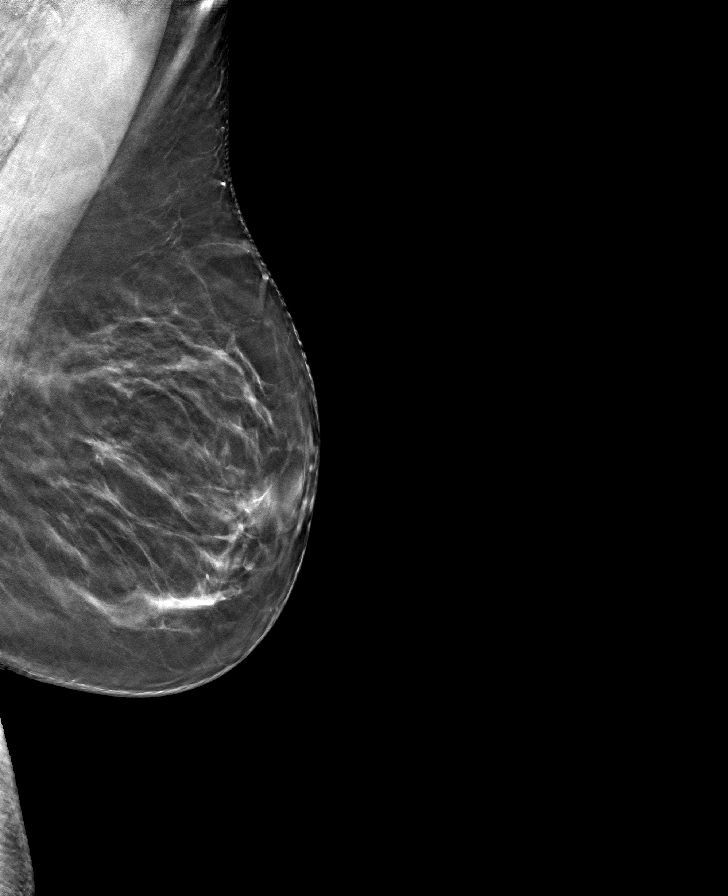

[L CC tomo · tomo slice 33/65.0]
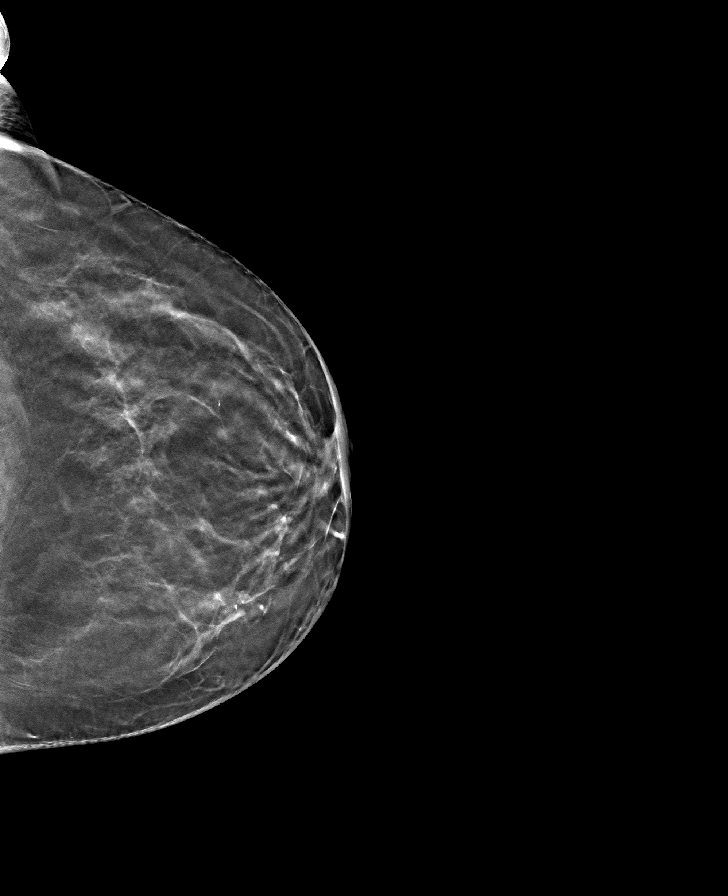

[R CC tomo · tomo slice 31/60.0]
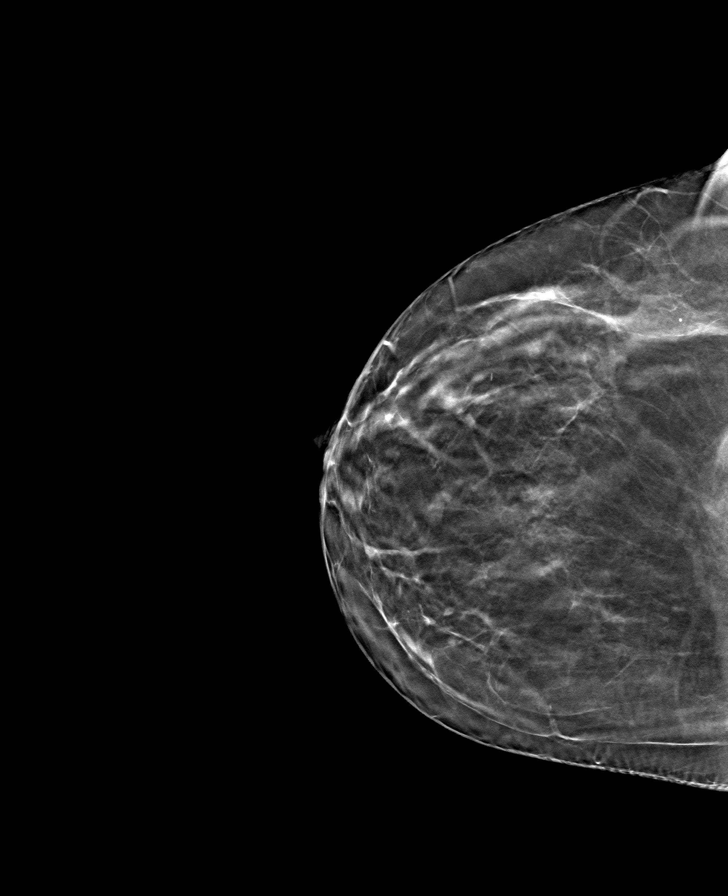

[R MLO tomo · tomo slice 35/68.0]
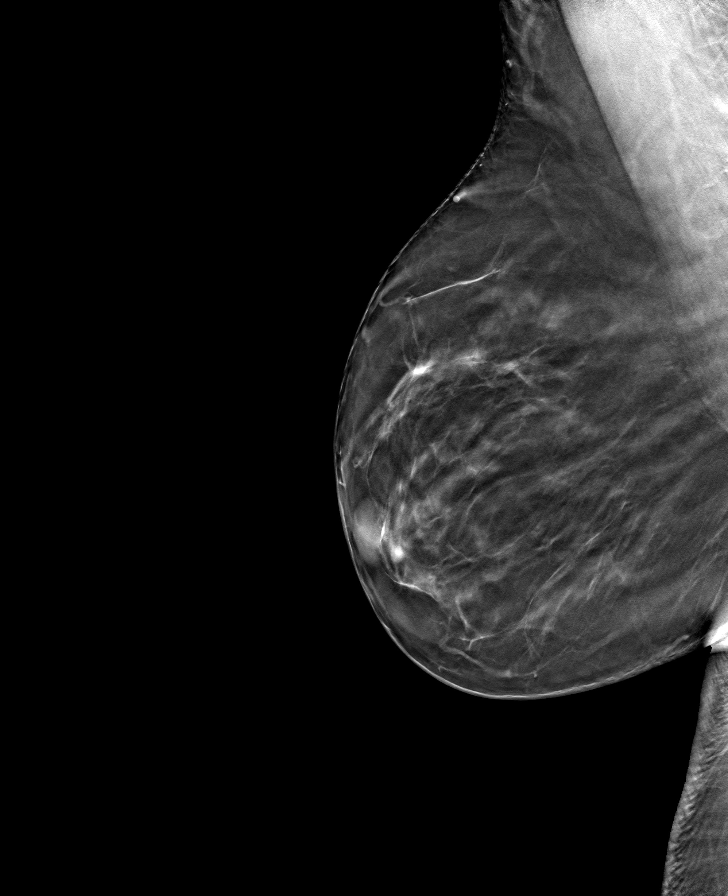

[8 of 24 positions shown; findings below may reference images not displayed]

ACR Breast Density Category b: There are scattered areas of
fibroglandular density.
FINDINGS: No focal or suspicious mammographic findings are identified in
either breast. The parenchymal pattern is stable.

Targeted ultrasound is performed, showing no focal or suspicious
sonographic findings in the lateral left breast.
IMPRESSION: 1. No mammographic evidence of malignancy in either breast.
2. Unremarkable sonographic evaluation of the left breast.

RECOMMENDATION:
1. Clinical follow-up recommended for the painful area of concern in
the left breast. Any further workup should be based on clinical
grounds.
2.  Screening mammogram in one year.(Code:[id number])

I have discussed the findings and recommendations with the patient.
If applicable, a reminder letter will be sent to the patient
regarding the next appointment.

BI-RADS CATEGORY  1: Negative.

## 2022-07-27 DIAGNOSIS — H52221 Regular astigmatism, right eye: Secondary | ICD-10-CM | POA: Diagnosis not present

## 2022-07-27 DIAGNOSIS — H524 Presbyopia: Secondary | ICD-10-CM | POA: Diagnosis not present

## 2022-08-16 ENCOUNTER — Other Ambulatory Visit (HOSPITAL_BASED_OUTPATIENT_CLINIC_OR_DEPARTMENT_OTHER): Payer: Self-pay

## 2022-08-24 ENCOUNTER — Other Ambulatory Visit (HOSPITAL_BASED_OUTPATIENT_CLINIC_OR_DEPARTMENT_OTHER): Payer: Self-pay

## 2022-08-25 ENCOUNTER — Other Ambulatory Visit (HOSPITAL_BASED_OUTPATIENT_CLINIC_OR_DEPARTMENT_OTHER): Payer: Self-pay

## 2022-08-25 MED ORDER — CYCLOSPORINE 0.05 % OP EMUL
OPHTHALMIC | 3 refills | Status: AC
  Filled 2022-08-25 – 2022-08-26 (×2): qty 180, 90d supply, fill #0

## 2022-08-26 ENCOUNTER — Other Ambulatory Visit (HOSPITAL_BASED_OUTPATIENT_CLINIC_OR_DEPARTMENT_OTHER): Payer: Self-pay

## 2022-09-12 ENCOUNTER — Other Ambulatory Visit: Payer: Self-pay

## 2022-09-12 ENCOUNTER — Other Ambulatory Visit (HOSPITAL_BASED_OUTPATIENT_CLINIC_OR_DEPARTMENT_OTHER): Payer: Self-pay

## 2022-09-13 ENCOUNTER — Other Ambulatory Visit (HOSPITAL_BASED_OUTPATIENT_CLINIC_OR_DEPARTMENT_OTHER): Payer: Self-pay

## 2022-09-21 ENCOUNTER — Other Ambulatory Visit (HOSPITAL_BASED_OUTPATIENT_CLINIC_OR_DEPARTMENT_OTHER): Payer: Self-pay

## 2022-10-04 ENCOUNTER — Encounter: Payer: Self-pay | Admitting: Internal Medicine

## 2022-10-05 ENCOUNTER — Other Ambulatory Visit (HOSPITAL_BASED_OUTPATIENT_CLINIC_OR_DEPARTMENT_OTHER): Payer: Self-pay

## 2022-10-05 MED ORDER — ROSUVASTATIN CALCIUM 10 MG PO TABS
10.0000 mg | ORAL_TABLET | Freq: Every day | ORAL | 0 refills | Status: DC
  Filled 2022-10-05: qty 30, 30d supply, fill #0

## 2022-10-05 NOTE — Telephone Encounter (Signed)
Ok to refill for the patient?

## 2022-10-10 ENCOUNTER — Encounter (HOSPITAL_BASED_OUTPATIENT_CLINIC_OR_DEPARTMENT_OTHER): Payer: Self-pay

## 2022-10-11 ENCOUNTER — Other Ambulatory Visit: Payer: Self-pay | Admitting: Internal Medicine

## 2022-10-11 ENCOUNTER — Other Ambulatory Visit (HOSPITAL_BASED_OUTPATIENT_CLINIC_OR_DEPARTMENT_OTHER): Payer: Self-pay

## 2022-10-11 MED ORDER — LISINOPRIL-HYDROCHLOROTHIAZIDE 20-12.5 MG PO TABS
1.0000 | ORAL_TABLET | Freq: Every day | ORAL | 0 refills | Status: DC
  Filled 2022-10-11: qty 30, 30d supply, fill #0
  Filled 2022-11-07: qty 30, 30d supply, fill #1

## 2022-10-11 NOTE — Addendum Note (Signed)
Addended by: Deatra James on: 10/11/2022 09:33 AM   Modules accepted: Orders

## 2022-10-14 ENCOUNTER — Other Ambulatory Visit (HOSPITAL_BASED_OUTPATIENT_CLINIC_OR_DEPARTMENT_OTHER): Payer: Self-pay

## 2022-10-18 ENCOUNTER — Encounter (HOSPITAL_BASED_OUTPATIENT_CLINIC_OR_DEPARTMENT_OTHER): Payer: Self-pay

## 2022-10-18 ENCOUNTER — Other Ambulatory Visit: Payer: Self-pay

## 2022-10-18 ENCOUNTER — Emergency Department (HOSPITAL_BASED_OUTPATIENT_CLINIC_OR_DEPARTMENT_OTHER): Payer: Managed Care, Other (non HMO) | Admitting: Radiology

## 2022-10-18 ENCOUNTER — Emergency Department (HOSPITAL_BASED_OUTPATIENT_CLINIC_OR_DEPARTMENT_OTHER)
Admission: EM | Admit: 2022-10-18 | Discharge: 2022-10-18 | Disposition: A | Discharge status: Home / Self Care | Payer: Managed Care, Other (non HMO) | Attending: Emergency Medicine | Admitting: Emergency Medicine

## 2022-10-18 DIAGNOSIS — W260XXA Contact with knife, initial encounter: Secondary | ICD-10-CM | POA: Insufficient documentation

## 2022-10-18 DIAGNOSIS — Z79899 Other long term (current) drug therapy: Secondary | ICD-10-CM | POA: Diagnosis not present

## 2022-10-18 DIAGNOSIS — S61012A Laceration without foreign body of left thumb without damage to nail, initial encounter: Secondary | ICD-10-CM | POA: Insufficient documentation

## 2022-10-18 DIAGNOSIS — S60932A Unspecified superficial injury of left thumb, initial encounter: Secondary | ICD-10-CM | POA: Diagnosis present

## 2022-10-18 NOTE — ED Triage Notes (Signed)
Pt c/o laceration to left thumb today while cutting veggies for dinner

## 2022-10-18 NOTE — ED Provider Notes (Signed)
 [place] EMERGENCY DEPARTMENT AT [facility] Provider Note   CSN: [id number] Arrival date & time: 10/18/22  1659     History Chief Complaint  Patient presents with   Laceration    Terry English is a 57 y.o. female.  Patient presents to the emergency department concerns of a finger laceration.  Reports that she cut the finger while she was chopping vegetables for dinner.  States that there was significant bleeding with this but bleeding controlled prior to being evaluated.  Not current on any blood thinner.  Denies any loss of function or sensation in the finger.  Patient reports that she is up-to-date on Tdap.  Laceration      Home Medications Prior to Admission medications   Medication Sig Start Date End Date Taking? Authorizing Provider  clonazePAM (KLONOPIN) 0.5 MG tablet 1/2 to 1 tablet Orally once a day if needed 30 days 09/29/21     cyclobenzaprine (FLEXERIL) 10 MG tablet 1 tablet at bedtime as needed Orally Once a day 30 day(s) 09/29/21     cycloSPORINE (RESTASIS) 0.05 % ophthalmic emulsion Instill 1 drop into both eyes twice a day as directed 08/25/22     escitalopram (LEXAPRO) 10 MG tablet TAKE 1 TABLET BY MOUTH ONCE A DAY 08/29/19 08/31/20  Shirlean Mylar, MD  estradiol (VIVELLE-DOT) 0.05 MG/24HR patch Place 1 patch (0.05 mg total) onto the skin 2 (two) times a week. 03/17/22     lisinopril-hydrochlorothiazide (ZESTORETIC) 20-12.5 MG tablet Take 1 tablet by mouth daily. Must see provider at October appointment for future refills. 10/11/22   Myrlene Broker, MD  progesterone (PROMETRIUM) 100 MG capsule Take 1 capsule (100 mg total) by mouth daily. 03/22/22     rosuvastatin (CRESTOR) 10 MG tablet Take 1 tablet (10 mg total) by mouth at bedtime. 10/05/22     triamcinolone cream (KENALOG) 0.1 % Apply 1 Application topically 2 (two) times daily as needed for flares for 14 days 09/03/21     levonorgestrel-ethinyl estradiol (LYBREL) 90-20 MCG tablet Take 1 tablet by mouth daily.  03/04/22 03/23/22  Myrlene Broker, MD      Allergies    Imitrex [sumatriptan]    Review of Systems   Review of Systems  Skin:  Positive for wound.  All other systems reviewed and are negative.   Physical Exam Updated Vital Signs BP (!) 140/99 (BP Location: Right Arm)   Pulse 78   Temp 98 F (36.7 C) (Oral)   Resp 18   Ht 5\' 6"  (1.676 m)   Wt 90.7 kg   LMP 03/01/2019   SpO2 100%   BMI 32.28 kg/m  Physical Exam Vitals and nursing note reviewed.  Constitutional:      General: She is not in acute distress.    Appearance: She is well-developed.  HENT:     Head: Normocephalic and atraumatic.  Eyes:     Conjunctiva/sclera: Conjunctivae normal.  Cardiovascular:     Rate and Rhythm: Normal rate and regular rhythm.     Heart sounds: No murmur heard. Pulmonary:     Effort: Pulmonary effort is normal. No respiratory distress.     Breath sounds: Normal breath sounds.  Abdominal:     Palpations: Abdomen is soft.     Tenderness: There is no abdominal tenderness.  Musculoskeletal:        General: No swelling.     Cervical back: Neck supple.  Skin:    General: Skin is warm and dry.  Capillary Refill: Capillary refill takes less than 2 seconds.     Findings: Lesion present.     Comments: 1cm laceration noted to the distal tip of the left thumb  Neurological:     Mental Status: She is alert.  Psychiatric:        Mood and Affect: Mood normal.     ED Results / Procedures / Treatments   Labs (all labs ordered are listed, but only abnormal results are displayed) Labs Reviewed - No data to display  EKG None  Radiology DG Finger Thumb Left  Result Date: 10/18/2022 CLINICAL DATA:  Laceration to left thumb EXAM: LEFT THUMB 2+V COMPARISON:  None Available. FINDINGS: There is no evidence of fracture or dislocation. Soft tissue laceration about the proximal phalanx of the thumb. Bandage material about the thumb. IMPRESSION: No acute fracture. Electronically Signed   By:  Minerva Fester M.D.   On: 10/18/2022 20:58    Procedures Procedures   Medications Ordered in ED Medications - No data to display  ED Course/ Medical Decision Making/ A&P                               Medical Decision Making Amount and/or Complexity of Data Reviewed Radiology: ordered.   This patient presents to the ED for concern of laceration.  Differential diagnosis includes superficial abrasion, tendon injury, nail plate injury   Imaging Studies ordered:  I ordered imaging studies including x-ray of left thumb I independently visualized and interpreted imaging which showed no evidence of any retained foreign objects or bony injury I agree with the radiologist interpretation   Problem List / ED Course:  Patient arrives in the emergency department concerns of a laceration to the left thumb.  Reports that she was cutting vegetables when she got into her left thumb.  Reports notable bleeding prior to arriving emergency department.  Not currently anticoagulated.  On my initial assessment, no active bleeding is noted when I remove the dressing.  Will order x-ray imaging for evaluation of laceration given proximity to the nailbed as well as distal tip of the finger. X-ray negative for any acute findings.  Patient up-to-date on Tdap.  After some cleaning of the laceration itself, bleeding controlled and minimal depth noted.  Wound is likely already started to close and heal well on its own.  No acute indication for suture repair, Dermabond, or other wound closure.  It has advise keeping wound dressed and closed with slight compression dressing for the next 24 hours to promote healing.  Also advised gentle cleansing with warm soapy water but to avoid using alcohol or hydrogen peroxide.  No indication for Tdap as patient is up-to-date.  Discussed strict return precautions such as evidence of infection including erythema, drainage, swelling and pain. Patient is agreeable with treatment plan and  verbalized understanding all return precautions. All questions answered prior to patient discharge.  Final Clinical Impression(s) / ED Diagnoses Final diagnoses:  Laceration of left thumb without foreign body without damage to nail, initial encounter    Rx / DC Orders ED Discharge Orders     None         Smitty Knudsen, PA-C 10/19/22 0017    Laurence Spates, MD 10/19/22 [phone]

## 2022-10-18 NOTE — Discharge Instructions (Addendum)
You were seen in the ER today for a finger laceration. Your xray was unremarkable so no acute concern at this time for any deeper injury. It appears that the wound started to close off on its own fairly well so I would not advise stitches or glue at this point. Try to keep the area clean and dry, but you may clean it with warm soapy water twice daily if needed. Return to the ER if the cut opens up again but otherwise manage this at home as best you can. If signs of infection develop, return to the ER.

## 2022-10-19 ENCOUNTER — Telehealth: Payer: Self-pay

## 2022-10-19 NOTE — Transitions of Care (Post Inpatient/ED Visit) (Signed)
   10/19/2022  Name: Terry English MRN: [id number] DOB: [birth year]  Today's TOC FU Call Status: Today's TOC FU Call Status:: Unsuccessful Call (1st Attempt) Unsuccessful Call (1st Attempt) Date: 10/19/22  Attempted to reach the patient regarding the most recent Inpatient/ED visit.  Follow Up Plan: Additional outreach attempts will be made to reach the patient to complete the Transitions of Care (Post Inpatient/ED visit) call.   Signature Karena Addison, LPN [facility] Nurse Health Advisor Direct Dial [phone]

## 2022-10-20 NOTE — Transitions of Care (Post Inpatient/ED Visit) (Signed)
   10/20/2022  Name: Terry English MRN: [id number] DOB: 05-Feb-1966  Today's TOC FU Call Status: Today's TOC FU Call Status:: Unsuccessful Call (2nd Attempt) Unsuccessful Call (1st Attempt) Date: 10/19/22 Unsuccessful Call (2nd Attempt) Date: 10/20/22  Attempted to reach the patient regarding the most recent Inpatient/ED visit.  Follow Up Plan: Additional outreach attempts will be made to reach the patient to complete the Transitions of Care (Post Inpatient/ED visit) call.   Signature Karena Addison, LPN [facility] Nurse Health Advisor Direct Dial [phone]

## 2022-10-24 NOTE — Transitions of Care (Post Inpatient/ED Visit) (Signed)
   10/24/2022  Name: Terry English MRN: [id number] DOB: 04-08-1965  Today's TOC FU Call Status: Today's TOC FU Call Status:: Unsuccessful Call (3rd Attempt) Unsuccessful Call (1st Attempt) Date: 10/19/22 Unsuccessful Call (2nd Attempt) Date: 10/20/22 Unsuccessful Call (3rd Attempt) Date: 10/24/22  Attempted to reach the patient regarding the most recent Inpatient/ED visit.  Follow Up Plan: No further outreach attempts will be made at this time. We have been unable to contact the patient.  Signature Karena Addison, LPN [facility] Nurse Health Advisor Direct Dial [phone]

## 2022-11-07 ENCOUNTER — Other Ambulatory Visit: Payer: Self-pay

## 2022-11-07 ENCOUNTER — Other Ambulatory Visit (HOSPITAL_BASED_OUTPATIENT_CLINIC_OR_DEPARTMENT_OTHER): Payer: Self-pay

## 2022-11-08 ENCOUNTER — Encounter: Payer: Self-pay | Admitting: Internal Medicine

## 2022-11-08 ENCOUNTER — Other Ambulatory Visit: Payer: Self-pay | Admitting: Internal Medicine

## 2022-11-08 ENCOUNTER — Encounter (HOSPITAL_BASED_OUTPATIENT_CLINIC_OR_DEPARTMENT_OTHER): Payer: Self-pay

## 2022-11-08 ENCOUNTER — Other Ambulatory Visit (HOSPITAL_BASED_OUTPATIENT_CLINIC_OR_DEPARTMENT_OTHER): Payer: Self-pay

## 2022-11-09 ENCOUNTER — Other Ambulatory Visit (HOSPITAL_BASED_OUTPATIENT_CLINIC_OR_DEPARTMENT_OTHER): Payer: Self-pay

## 2022-11-09 ENCOUNTER — Other Ambulatory Visit: Payer: Self-pay | Admitting: Internal Medicine

## 2022-11-09 MED ORDER — ROSUVASTATIN CALCIUM 10 MG PO TABS
10.0000 mg | ORAL_TABLET | Freq: Every day | ORAL | 3 refills | Status: DC
  Filled 2022-11-09: qty 30, 30d supply, fill #0

## 2022-11-09 NOTE — Telephone Encounter (Signed)
Ok to refill 

## 2022-11-14 ENCOUNTER — Other Ambulatory Visit: Payer: Self-pay

## 2022-11-14 ENCOUNTER — Other Ambulatory Visit (HOSPITAL_BASED_OUTPATIENT_CLINIC_OR_DEPARTMENT_OTHER): Payer: Self-pay

## 2022-12-05 ENCOUNTER — Other Ambulatory Visit (HOSPITAL_BASED_OUTPATIENT_CLINIC_OR_DEPARTMENT_OTHER): Payer: Self-pay

## 2022-12-05 ENCOUNTER — Ambulatory Visit (INDEPENDENT_AMBULATORY_CARE_PROVIDER_SITE_OTHER): Payer: Managed Care, Other (non HMO) | Admitting: Internal Medicine

## 2022-12-05 ENCOUNTER — Encounter: Payer: Self-pay | Admitting: Internal Medicine

## 2022-12-05 VITALS — BP 120/86 | HR 100 | Temp 98.3°F | Ht 66.0 in | Wt 194.0 lb

## 2022-12-05 DIAGNOSIS — I10 Essential (primary) hypertension: Secondary | ICD-10-CM

## 2022-12-05 DIAGNOSIS — R7303 Prediabetes: Secondary | ICD-10-CM | POA: Diagnosis not present

## 2022-12-05 DIAGNOSIS — E782 Mixed hyperlipidemia: Secondary | ICD-10-CM | POA: Diagnosis not present

## 2022-12-05 DIAGNOSIS — N951 Menopausal and female climacteric states: Secondary | ICD-10-CM

## 2022-12-05 LAB — CBC
HCT: 46.5 % — ABNORMAL HIGH (ref 36.0–46.0)
Hemoglobin: 15.9 g/dL — ABNORMAL HIGH (ref 12.0–15.0)
MCHC: 34.2 g/dL (ref 30.0–36.0)
MCV: 92.1 fL (ref 78.0–100.0)
Platelets: 305 10*3/uL (ref 150.0–400.0)
RBC: 5.04 Mil/uL (ref 3.87–5.11)
RDW: 13.4 % (ref 11.5–15.5)
WBC: 6.4 10*3/uL (ref 4.0–10.5)

## 2022-12-05 LAB — COMPREHENSIVE METABOLIC PANEL
ALT: 81 U/L — ABNORMAL HIGH (ref 0–35)
AST: 53 U/L — ABNORMAL HIGH (ref 0–37)
Albumin: 4.9 g/dL (ref 3.5–5.2)
Alkaline Phosphatase: 58 U/L (ref 39–117)
BUN: 16 mg/dL (ref 6–23)
CO2: 29 meq/L (ref 19–32)
Calcium: 10.1 mg/dL (ref 8.4–10.5)
Chloride: 101 meq/L (ref 96–112)
Creatinine, Ser: 0.96 mg/dL (ref 0.40–1.20)
GFR: 66.58 mL/min (ref >60.00)
Glucose, Bld: 104 mg/dL — ABNORMAL HIGH (ref 70–99)
Potassium: 4.1 meq/L (ref 3.5–5.1)
Sodium: 138 meq/L (ref 135–145)
Total Bilirubin: 1 mg/dL (ref 0.2–1.2)
Total Protein: 7.5 g/dL (ref 6.0–8.3)

## 2022-12-05 LAB — LIPID PANEL
Cholesterol: 170 mg/dL (ref 0–200)
HDL: 42.2 mg/dL (ref >39.00)
LDL Cholesterol: 88 mg/dL (ref 0–99)
NonHDL: 127.69
Total CHOL/HDL Ratio: 4
Triglycerides: 200 mg/dL — ABNORMAL HIGH (ref 0.0–149.0)
VLDL: 40 mg/dL (ref 0.0–40.0)

## 2022-12-05 LAB — HEMOGLOBIN A1C: Hgb A1c MFr Bld: 5.4 % (ref 4.6–6.5)

## 2022-12-05 LAB — MICROALBUMIN / CREATININE URINE RATIO
Creatinine,U: 170.2 mg/dL
Microalb Creat Ratio: 0.6 mg/g (ref 0.0–30.0)
Microalb, Ur: 1.1 mg/dL (ref 0.0–1.9)

## 2022-12-05 MED ORDER — ROSUVASTATIN CALCIUM 10 MG PO TABS: 10.0000 mg | ORAL_TABLET | Freq: Every day | ORAL | 3 refills | Status: DC

## 2022-12-05 MED ORDER — CYCLOBENZAPRINE HCL 10 MG PO TABS
10.0000 mg | ORAL_TABLET | Freq: Every evening | ORAL | 3 refills | Status: AC | PRN
  Filled 2022-12-05: qty 30, 15d supply, fill #0
  Filled 2023-06-09 (×2): qty 30, 15d supply, fill #1

## 2022-12-05 MED ORDER — LISINOPRIL-HYDROCHLOROTHIAZIDE 20-12.5 MG PO TABS: 1.0000 | ORAL_TABLET | Freq: Every day | ORAL | 3 refills | Status: DC

## 2022-12-05 NOTE — Patient Instructions (Signed)
We have sent in the refills to the local and mail order for you. We will check the labs today.

## 2022-12-05 NOTE — Progress Notes (Signed)
Subjective:   Patient ID: Terry English, female    DOB: 27-Nov-1965, 57 y.o.   MRN: [id number]  HPI The patient is here for follow up.  PMH, [facility], social history reviewed and updated  Review of Systems  Constitutional: Negative.   HENT: Negative.    Eyes: Negative.   Respiratory:  Negative for cough, chest tightness and shortness of breath.   Cardiovascular:  Negative for chest pain, palpitations and leg swelling.  Gastrointestinal:  Negative for abdominal distention, abdominal pain, constipation, diarrhea, nausea and vomiting.  Musculoskeletal: Negative.   Skin: Negative.   Neurological: Negative.   Psychiatric/Behavioral: Negative.      Objective:  Physical Exam Constitutional:      Appearance: She is well-developed.  HENT:     Head: Normocephalic and atraumatic.  Cardiovascular:     Rate and Rhythm: Normal rate and regular rhythm.  Pulmonary:     Effort: Pulmonary effort is normal. No respiratory distress.     Breath sounds: Normal breath sounds. No wheezing or rales.  Abdominal:     General: Bowel sounds are normal. There is no distension.     Palpations: Abdomen is soft.     Tenderness: There is no abdominal tenderness. There is no rebound.  Musculoskeletal:     Cervical back: Normal range of motion.  Skin:    General: Skin is warm and dry.  Neurological:     Mental Status: She is alert and oriented to person, place, and time.     Coordination: Coordination normal.     Vitals:   12/05/22 0806  BP: 120/86  Pulse: 100  Temp: 98.3 F (36.8 C)  TempSrc: Oral  SpO2: 97%  Weight: 194 lb (88 kg)  Height: 5\' 6"  (1.676 m)    Assessment & Plan:

## 2022-12-05 NOTE — Assessment & Plan Note (Signed)
Checking CMP and adjust lisinopril/hydrochlorothiazide 20/12,5 mg daily and BP at goal.

## 2022-12-05 NOTE — Assessment & Plan Note (Signed)
Checking lipid panel and adjust crestor 10 mg daily as needed. 

## 2022-12-05 NOTE — Assessment & Plan Note (Signed)
Checking HGA1c and adjust as needed. She has worked on diet and exercise and previous was improved significantly.

## 2022-12-05 NOTE — Assessment & Plan Note (Signed)
Continues on progesterone oral and estrogen patch. Symptoms are controlled.

## 2023-01-04 ENCOUNTER — Other Ambulatory Visit: Payer: Self-pay | Admitting: Internal Medicine

## 2023-01-05 ENCOUNTER — Other Ambulatory Visit (HOSPITAL_BASED_OUTPATIENT_CLINIC_OR_DEPARTMENT_OTHER): Payer: Self-pay

## 2023-01-08 ENCOUNTER — Encounter: Payer: Self-pay | Admitting: Internal Medicine

## 2023-01-09 ENCOUNTER — Telehealth: Payer: Self-pay

## 2023-01-09 ENCOUNTER — Other Ambulatory Visit (HOSPITAL_COMMUNITY): Payer: Self-pay

## 2023-01-09 NOTE — Telephone Encounter (Signed)
Pharmacy Patient Advocate Encounter   Received notification from RX Request Messages that prior authorization for [facility] 2.4mg /0.1ml is required/requested.   Insurance verification completed.   The patient is insured through [employer] .   Per test claim:  Not covered by plan. Member pays 100% cash rate, will not apply toward plan accumulators.

## 2023-01-11 ENCOUNTER — Other Ambulatory Visit (HOSPITAL_BASED_OUTPATIENT_CLINIC_OR_DEPARTMENT_OTHER): Payer: Self-pay

## 2023-01-11 MED ORDER — BUPROPION HCL ER (XL) 150 MG PO TB24
150.0000 mg | ORAL_TABLET | Freq: Every day | ORAL | 1 refills | Status: DC
Start: 2023-01-11 — End: 2023-05-04
  Filled 2023-01-11: qty 30, 30d supply, fill #0
  Filled 2023-02-03: qty 90, 90d supply, fill #1

## 2023-01-11 NOTE — Telephone Encounter (Signed)
Pharmacy is correct

## 2023-01-12 ENCOUNTER — Other Ambulatory Visit (HOSPITAL_BASED_OUTPATIENT_CLINIC_OR_DEPARTMENT_OTHER): Payer: Self-pay

## 2023-02-03 ENCOUNTER — Other Ambulatory Visit (HOSPITAL_BASED_OUTPATIENT_CLINIC_OR_DEPARTMENT_OTHER): Payer: Self-pay

## 2023-02-28 ENCOUNTER — Ambulatory Visit: Payer: Managed Care, Other (non HMO) | Admitting: Internal Medicine

## 2023-02-28 ENCOUNTER — Encounter: Payer: Self-pay | Admitting: Internal Medicine

## 2023-02-28 ENCOUNTER — Other Ambulatory Visit (HOSPITAL_BASED_OUTPATIENT_CLINIC_OR_DEPARTMENT_OTHER): Payer: Self-pay

## 2023-02-28 VITALS — BP 128/100 | HR 93 | Temp 98.7°F | Ht 66.0 in | Wt 192.0 lb

## 2023-02-28 DIAGNOSIS — J069 Acute upper respiratory infection, unspecified: Secondary | ICD-10-CM

## 2023-02-28 MED ORDER — FLUTICASONE PROPIONATE 50 MCG/ACT NA SUSP
2.0000 | Freq: Every day | NASAL | 6 refills | Status: AC
  Filled 2023-02-28: qty 16, 30d supply, fill #0
  Filled 2023-03-25: qty 16, 30d supply, fill #1
  Filled 2023-04-24: qty 16, 30d supply, fill #2
  Filled 2023-05-24: qty 16, 30d supply, fill #3

## 2023-02-28 NOTE — Progress Notes (Signed)
   Subjective:   Patient ID: Terry English, female    DOB: 04-Sep-1965, 57 y.o.   MRN: [id number]  HPI The patient is a 57 YO female coming in for viral symptoms and left ear congested. Going on about 1 week.  Review of Systems  Constitutional:  Negative for activity change, appetite change, chills, fatigue, fever and unexpected weight change.  HENT:  Positive for congestion and rhinorrhea. Negative for ear discharge, ear pain, postnasal drip, sinus pressure, sinus pain, sneezing, sore throat, tinnitus, trouble swallowing and voice change.        Ear congestion  Eyes: Negative.   Respiratory:  Positive for cough. Negative for chest tightness, shortness of breath and wheezing.   Cardiovascular: Negative.   Gastrointestinal: Negative.   Musculoskeletal:  Negative for myalgias.  Neurological: Negative.     Objective:  Physical Exam Constitutional:      Appearance: She is well-developed.  HENT:     Head: Normocephalic and atraumatic.     Comments: Oropharynx with redness and clear drainage, nose with swollen turbinates, TMs normal bilaterally.     Right Ear: Tympanic membrane, ear canal and external ear normal.     Left Ear: Tympanic membrane, ear canal and external ear normal.  Neck:     Thyroid : No thyromegaly.  Cardiovascular:     Rate and Rhythm: Normal rate and regular rhythm.  Pulmonary:     Effort: Pulmonary effort is normal. No respiratory distress.     Breath sounds: Normal breath sounds. No wheezing or rales.  Abdominal:     Palpations: Abdomen is soft.  Musculoskeletal:        General: No tenderness.     Cervical back: Normal range of motion.  Lymphadenopathy:     Cervical: No cervical adenopathy.  Skin:    General: Skin is warm and dry.  Neurological:     Mental Status: She is alert and oriented to person, place, and time.     Vitals:   02/28/23 1100  BP: (!) 128/100  Pulse: 93  Temp: 98.7 F (37.1 C)  TempSrc: Oral  SpO2: 98%  Weight: 192 lb (87.1 kg)   Height: 5' 6 (1.676 m)    Assessment & Plan:

## 2023-02-28 NOTE — Assessment & Plan Note (Signed)
Rx flonase to facilitate drainage left nostril and ear. No signs of bacterial infection reassurance given.

## 2023-03-10 ENCOUNTER — Encounter: Payer: Self-pay | Admitting: Internal Medicine

## 2023-03-10 NOTE — Telephone Encounter (Signed)
Please advise for patient as MD is out of office

## 2023-03-17 ENCOUNTER — Other Ambulatory Visit (HOSPITAL_BASED_OUTPATIENT_CLINIC_OR_DEPARTMENT_OTHER): Payer: Self-pay

## 2023-03-17 MED ORDER — ESTRADIOL 0.05 MG/24HR TD PTTW
1.0000 | MEDICATED_PATCH | TRANSDERMAL | 0 refills | Status: DC
  Filled 2023-03-17: qty 24, 84d supply, fill #0

## 2023-03-23 ENCOUNTER — Other Ambulatory Visit (HOSPITAL_BASED_OUTPATIENT_CLINIC_OR_DEPARTMENT_OTHER): Payer: Self-pay

## 2023-03-23 MED ORDER — PROGESTERONE MICRONIZED 100 MG PO CAPS
100.0000 mg | ORAL_CAPSULE | Freq: Every day | ORAL | 0 refills | Status: DC
  Filled 2023-03-23: qty 90, 90d supply, fill #0

## 2023-03-27 ENCOUNTER — Other Ambulatory Visit (HOSPITAL_BASED_OUTPATIENT_CLINIC_OR_DEPARTMENT_OTHER): Payer: Self-pay

## 2023-04-27 ENCOUNTER — Other Ambulatory Visit (HOSPITAL_BASED_OUTPATIENT_CLINIC_OR_DEPARTMENT_OTHER): Payer: Self-pay

## 2023-05-04 ENCOUNTER — Other Ambulatory Visit: Payer: Self-pay | Admitting: Internal Medicine

## 2023-05-05 ENCOUNTER — Other Ambulatory Visit (HOSPITAL_BASED_OUTPATIENT_CLINIC_OR_DEPARTMENT_OTHER): Payer: Self-pay

## 2023-05-05 MED ORDER — BUPROPION HCL ER (XL) 150 MG PO TB24
150.0000 mg | ORAL_TABLET | Freq: Every day | ORAL | 1 refills | Status: DC
  Filled 2023-05-05 – 2023-05-16 (×2): qty 90, 90d supply, fill #0
  Filled 2023-08-14: qty 90, 90d supply, fill #1

## 2023-05-15 ENCOUNTER — Other Ambulatory Visit (HOSPITAL_BASED_OUTPATIENT_CLINIC_OR_DEPARTMENT_OTHER): Payer: Self-pay

## 2023-05-16 ENCOUNTER — Other Ambulatory Visit (HOSPITAL_BASED_OUTPATIENT_CLINIC_OR_DEPARTMENT_OTHER): Payer: Self-pay

## 2023-06-05 ENCOUNTER — Other Ambulatory Visit (HOSPITAL_BASED_OUTPATIENT_CLINIC_OR_DEPARTMENT_OTHER): Payer: Self-pay

## 2023-06-09 ENCOUNTER — Other Ambulatory Visit (HOSPITAL_BASED_OUTPATIENT_CLINIC_OR_DEPARTMENT_OTHER): Payer: Self-pay

## 2023-06-09 MED ORDER — ESTRADIOL 0.05 MG/24HR TD PTTW
1.0000 | MEDICATED_PATCH | TRANSDERMAL | 0 refills | Status: DC
  Filled 2023-06-09: qty 24, 84d supply, fill #0

## 2023-06-12 ENCOUNTER — Other Ambulatory Visit: Payer: Self-pay

## 2023-06-15 ENCOUNTER — Other Ambulatory Visit (HOSPITAL_BASED_OUTPATIENT_CLINIC_OR_DEPARTMENT_OTHER): Payer: Self-pay

## 2023-06-15 MED ORDER — PROGESTERONE MICRONIZED 100 MG PO CAPS
100.0000 mg | ORAL_CAPSULE | Freq: Every day | ORAL | 0 refills | Status: DC
  Filled 2023-06-15 – 2023-07-12 (×2): qty 90, 90d supply, fill #0

## 2023-06-26 ENCOUNTER — Other Ambulatory Visit (HOSPITAL_BASED_OUTPATIENT_CLINIC_OR_DEPARTMENT_OTHER): Payer: Self-pay

## 2023-07-12 ENCOUNTER — Other Ambulatory Visit (HOSPITAL_BASED_OUTPATIENT_CLINIC_OR_DEPARTMENT_OTHER): Payer: Self-pay

## 2023-08-14 ENCOUNTER — Other Ambulatory Visit (HOSPITAL_BASED_OUTPATIENT_CLINIC_OR_DEPARTMENT_OTHER): Payer: Self-pay

## 2023-08-24 ENCOUNTER — Other Ambulatory Visit (HOSPITAL_BASED_OUTPATIENT_CLINIC_OR_DEPARTMENT_OTHER): Payer: Self-pay

## 2023-08-28 ENCOUNTER — Other Ambulatory Visit (HOSPITAL_BASED_OUTPATIENT_CLINIC_OR_DEPARTMENT_OTHER): Payer: Self-pay

## 2023-08-28 MED ORDER — TRIAMCINOLONE ACETONIDE 0.1 % EX CREA
1.0000 | TOPICAL_CREAM | Freq: Two times a day (BID) | CUTANEOUS | 1 refills | Status: AC
  Filled 2023-08-28: qty 60, 30d supply, fill #0

## 2023-08-31 ENCOUNTER — Other Ambulatory Visit (HOSPITAL_BASED_OUTPATIENT_CLINIC_OR_DEPARTMENT_OTHER): Payer: Self-pay

## 2023-09-01 ENCOUNTER — Other Ambulatory Visit (HOSPITAL_BASED_OUTPATIENT_CLINIC_OR_DEPARTMENT_OTHER): Payer: Self-pay

## 2023-09-02 ENCOUNTER — Other Ambulatory Visit (HOSPITAL_BASED_OUTPATIENT_CLINIC_OR_DEPARTMENT_OTHER): Payer: Self-pay

## 2023-09-02 MED ORDER — ESTRADIOL 0.05 MG/24HR TD PTTW
1.0000 | MEDICATED_PATCH | TRANSDERMAL | 0 refills | Status: DC
  Filled 2023-09-02: qty 24, 84d supply, fill #0

## 2023-09-05 ENCOUNTER — Other Ambulatory Visit (HOSPITAL_BASED_OUTPATIENT_CLINIC_OR_DEPARTMENT_OTHER): Payer: Self-pay

## 2023-09-05 MED ORDER — PREDNISONE 10 MG PO TABS
ORAL_TABLET | ORAL | 0 refills | Status: DC
  Filled 2023-09-05: qty 48, 12d supply, fill #0

## 2023-10-03 ENCOUNTER — Other Ambulatory Visit (HOSPITAL_BASED_OUTPATIENT_CLINIC_OR_DEPARTMENT_OTHER): Payer: Self-pay

## 2023-10-03 MED ORDER — PROGESTERONE MICRONIZED 100 MG PO CAPS
100.0000 mg | ORAL_CAPSULE | Freq: Every day | ORAL | 4 refills | Status: AC
  Filled 2023-10-03: qty 90, 90d supply, fill #0

## 2023-10-03 NOTE — Procedures (Signed)
 SABRA

## 2023-11-07 ENCOUNTER — Encounter (HOSPITAL_BASED_OUTPATIENT_CLINIC_OR_DEPARTMENT_OTHER): Payer: Self-pay

## 2023-11-07 ENCOUNTER — Encounter: Payer: Self-pay | Admitting: Internal Medicine

## 2023-11-08 ENCOUNTER — Other Ambulatory Visit (HOSPITAL_BASED_OUTPATIENT_CLINIC_OR_DEPARTMENT_OTHER): Payer: Self-pay

## 2023-11-08 ENCOUNTER — Other Ambulatory Visit: Payer: Self-pay

## 2023-11-08 MED ORDER — BUPROPION HCL ER (XL) 150 MG PO TB24: 150.0000 mg | ORAL_TABLET | Freq: Every day | ORAL | 0 refills | Status: DC

## 2023-11-24 ENCOUNTER — Other Ambulatory Visit (HOSPITAL_BASED_OUTPATIENT_CLINIC_OR_DEPARTMENT_OTHER): Payer: Self-pay

## 2023-11-24 MED ORDER — ESTRADIOL 0.05 MG/24HR TD PTTW
1.0000 | MEDICATED_PATCH | TRANSDERMAL | 2 refills | Status: AC
  Filled 2023-11-24: qty 24, 84d supply, fill #0

## 2023-11-27 ENCOUNTER — Other Ambulatory Visit (HOSPITAL_BASED_OUTPATIENT_CLINIC_OR_DEPARTMENT_OTHER): Payer: Self-pay

## 2023-11-27 MED ORDER — WEGOVY 0.25 MG/0.5ML ~~LOC~~ SOAJ
0.2500 mg | SUBCUTANEOUS | 1 refills | Status: AC
  Filled 2023-11-27: qty 2, 28d supply, fill #0
  Filled 2023-12-22: qty 2, 28d supply, fill #1

## 2023-11-30 ENCOUNTER — Other Ambulatory Visit (HOSPITAL_BASED_OUTPATIENT_CLINIC_OR_DEPARTMENT_OTHER): Payer: Self-pay

## 2023-11-30 ENCOUNTER — Other Ambulatory Visit: Payer: Self-pay | Admitting: Internal Medicine

## 2023-12-11 ENCOUNTER — Encounter: Payer: Managed Care, Other (non HMO) | Admitting: Internal Medicine

## 2023-12-14 ENCOUNTER — Other Ambulatory Visit (HOSPITAL_BASED_OUTPATIENT_CLINIC_OR_DEPARTMENT_OTHER): Payer: Self-pay

## 2023-12-14 MED ORDER — NEOMYCIN-POLYMYXIN-DEXAMETH 0.1 % OP SUSP
1.0000 [drp] | Freq: Four times a day (QID) | OPHTHALMIC | 0 refills | Status: AC
  Filled 2023-12-14: qty 5, 25d supply, fill #0

## 2023-12-19 ENCOUNTER — Ambulatory Visit (INDEPENDENT_AMBULATORY_CARE_PROVIDER_SITE_OTHER): Admitting: Internal Medicine

## 2023-12-19 VITALS — BP 126/82 | HR 86 | Temp 98.9°F | Ht 66.0 in | Wt 199.6 lb

## 2023-12-19 DIAGNOSIS — R7303 Prediabetes: Secondary | ICD-10-CM | POA: Diagnosis not present

## 2023-12-19 DIAGNOSIS — I10 Essential (primary) hypertension: Secondary | ICD-10-CM

## 2023-12-19 DIAGNOSIS — Z0000 Encounter for general adult medical examination without abnormal findings: Secondary | ICD-10-CM | POA: Diagnosis not present

## 2023-12-19 DIAGNOSIS — E66811 Obesity, class 1: Secondary | ICD-10-CM

## 2023-12-19 DIAGNOSIS — N951 Menopausal and female climacteric states: Secondary | ICD-10-CM

## 2023-12-19 DIAGNOSIS — E782 Mixed hyperlipidemia: Secondary | ICD-10-CM

## 2023-12-19 LAB — VITAMIN B12: Vitamin B-12: 334 pg/mL (ref 211–911)

## 2023-12-19 LAB — LIPID PANEL
Cholesterol: 161 mg/dL (ref 0–200)
HDL: 44.7 mg/dL (ref >39.00)
LDL Cholesterol: 80 mg/dL (ref 0–99)
NonHDL: 116.43
Total CHOL/HDL Ratio: 4
Triglycerides: 180 mg/dL — ABNORMAL HIGH (ref 0.0–149.0)
VLDL: 36 mg/dL (ref 0.0–40.0)

## 2023-12-19 LAB — COMPREHENSIVE METABOLIC PANEL WITH GFR
ALT: 43 U/L — ABNORMAL HIGH (ref 0–35)
AST: 29 U/L (ref 0–37)
Albumin: 4.9 g/dL (ref 3.5–5.2)
Alkaline Phosphatase: 56 U/L (ref 39–117)
BUN: 18 mg/dL (ref 6–23)
CO2: 27 meq/L (ref 19–32)
Calcium: 9.8 mg/dL (ref 8.4–10.5)
Chloride: 102 meq/L (ref 96–112)
Creatinine, Ser: 0.93 mg/dL (ref 0.40–1.20)
GFR: 68.66 mL/min (ref >60.00)
Glucose, Bld: 110 mg/dL — ABNORMAL HIGH (ref 70–99)
Potassium: 4.3 meq/L (ref 3.5–5.1)
Sodium: 138 meq/L (ref 135–145)
Total Bilirubin: 0.6 mg/dL (ref 0.2–1.2)
Total Protein: 7.1 g/dL (ref 6.0–8.3)

## 2023-12-19 LAB — CBC
HCT: 45.6 % (ref 36.0–46.0)
Hemoglobin: 15.5 g/dL — ABNORMAL HIGH (ref 12.0–15.0)
MCHC: 33.9 g/dL (ref 30.0–36.0)
MCV: 91.8 fl (ref 78.0–100.0)
Platelets: 271 K/uL (ref 150.0–400.0)
RBC: 4.97 Mil/uL (ref 3.87–5.11)
RDW: 13.6 % (ref 11.5–15.5)
WBC: 7.1 K/uL (ref 4.0–10.5)

## 2023-12-19 LAB — VITAMIN D 25 HYDROXY (VIT D DEFICIENCY, FRACTURES): VITD: 23.84 ng/mL — ABNORMAL LOW (ref 30.00–100.00)

## 2023-12-19 LAB — HEMOGLOBIN A1C: Hgb A1c MFr Bld: 5.9 % (ref 4.6–6.5)

## 2023-12-19 LAB — TSH: TSH: 1.97 u[IU]/mL (ref 0.35–5.50)

## 2023-12-19 NOTE — Progress Notes (Unsigned)
   Subjective:   Patient ID: Terry English, female    DOB: December 13, 1965, 58 y.o.   MRN: [id number]  The patient is here for physical. Pertinent topics discussed: Discussed the use of AI scribe software for clinical note transcription with the patient, who gave verbal consent to proceed.  History of Present Illness [name] is a 58 year old female who presents with eye symptoms and sinus discomfort.  She began experiencing eye symptoms last Thursday, initially affecting one eye and then spreading to the other. The symptoms are less severe than previous episodes of conjunctivitis. She was prescribed a steroid antibiotic. She notes crusty discharge at night, which has been better recently.  She has a history of back issues due to arthritis, for which she has seen an orthopedic specialist. She manages flare-ups with prednisone  and regular stretching exercises.  She is a Runner, broadcasting/film/video currently on fall break and uses this time to attend to her health appointments. She wants to exercise more but feels limited by fatigue.  PMH, [facility], social history reviewed and updated  Review of Systems  Constitutional:  Positive for fatigue.  HENT: Negative.    Eyes: Negative.   Respiratory:  Negative for cough, chest tightness and shortness of breath.   Cardiovascular:  Negative for chest pain, palpitations and leg swelling.  Gastrointestinal:  Negative for abdominal distention, abdominal pain, constipation, diarrhea, nausea and vomiting.  Musculoskeletal:  Positive for arthralgias.  Skin: Negative.   Neurological: Negative.   Psychiatric/Behavioral: Negative.      Objective:  Physical Exam Constitutional:      Appearance: She is well-developed.  HENT:     Head: Normocephalic and atraumatic.  Cardiovascular:     Rate and Rhythm: Normal rate and regular rhythm.  Pulmonary:     Effort: Pulmonary effort is normal. No respiratory distress.     Breath sounds: Normal breath sounds. No wheezing or rales.   Abdominal:     General: Bowel sounds are normal. There is no distension.     Palpations: Abdomen is soft.     Tenderness: There is no abdominal tenderness.  Musculoskeletal:     Cervical back: Normal range of motion.  Skin:    General: Skin is warm and dry.  Neurological:     Mental Status: She is alert and oriented to person, place, and time.     Coordination: Coordination normal.     Vitals:   12/19/23 0958  BP: 126/82  Pulse: 86  Temp: 98.9 F (37.2 C)  TempSrc: Oral  SpO2: 97%  Weight: 199 lb 9.6 oz (90.5 kg)  Height: 5' 6 (1.676 m)    Assessment & Plan:

## 2023-12-20 ENCOUNTER — Encounter: Payer: Self-pay | Admitting: Internal Medicine

## 2023-12-20 ENCOUNTER — Ambulatory Visit: Payer: Self-pay | Admitting: Internal Medicine

## 2023-12-20 ENCOUNTER — Other Ambulatory Visit (HOSPITAL_BASED_OUTPATIENT_CLINIC_OR_DEPARTMENT_OTHER): Payer: Self-pay

## 2023-12-20 MED ORDER — VITAMIN D (ERGOCALCIFEROL) 1.25 MG (50000 UNIT) PO CAPS
50000.0000 [IU] | ORAL_CAPSULE | ORAL | 0 refills | Status: AC
  Filled 2023-12-20: qty 4, 28d supply, fill #0
  Filled 2024-01-10: qty 4, 28d supply, fill #1
  Filled 2024-02-08: qty 4, 28d supply, fill #2

## 2023-12-21 ENCOUNTER — Other Ambulatory Visit (HOSPITAL_BASED_OUTPATIENT_CLINIC_OR_DEPARTMENT_OTHER): Payer: Self-pay

## 2023-12-21 NOTE — Assessment & Plan Note (Signed)
 BP at goal on regimen. Checking labs and adjust as needed.

## 2023-12-21 NOTE — Assessment & Plan Note (Signed)
 Checking HgA1c and adjust as needed.

## 2023-12-21 NOTE — Assessment & Plan Note (Signed)
 Counseled about diet and exercise.

## 2023-12-21 NOTE — Assessment & Plan Note (Signed)
 Flu shot declines. Pneumonia declines. Shingrix complete. Tetanus up to date. Colonoscopy up to date. Mammogram up to date with gyn, pap smear up to date with gyn. Counseled about sun safety and mole surveillance. Counseled about the dangers of distracted driving. Given 10 year screening recommendations.

## 2023-12-21 NOTE — Assessment & Plan Note (Signed)
 Taking estrogen and progesterone  and symptoms are controlled. Continue.

## 2023-12-21 NOTE — Assessment & Plan Note (Signed)
 Checking lipid panel and adjust as needed.

## 2024-01-18 ENCOUNTER — Other Ambulatory Visit (HOSPITAL_BASED_OUTPATIENT_CLINIC_OR_DEPARTMENT_OTHER): Payer: Self-pay

## 2024-01-22 ENCOUNTER — Other Ambulatory Visit (HOSPITAL_BASED_OUTPATIENT_CLINIC_OR_DEPARTMENT_OTHER): Payer: Self-pay

## 2024-01-22 MED ORDER — PREDNISOLONE ACETATE 1 % OP SUSP
1.0000 [drp] | Freq: Four times a day (QID) | OPHTHALMIC | 0 refills | Status: AC
  Filled 2024-01-22: qty 5, 13d supply, fill #0

## 2024-01-22 MED ORDER — WEGOVY 0.5 MG/0.5ML ~~LOC~~ SOAJ
0.5000 mg | SUBCUTANEOUS | 1 refills | Status: DC
  Filled 2024-01-22: qty 2, 28d supply, fill #0
  Filled 2024-02-12 – 2024-02-15 (×2): qty 2, 28d supply, fill #1

## 2024-02-12 ENCOUNTER — Other Ambulatory Visit (HOSPITAL_BASED_OUTPATIENT_CLINIC_OR_DEPARTMENT_OTHER): Payer: Self-pay

## 2024-02-13 ENCOUNTER — Other Ambulatory Visit: Payer: Self-pay | Admitting: Internal Medicine

## 2024-03-20 ENCOUNTER — Other Ambulatory Visit (HOSPITAL_BASED_OUTPATIENT_CLINIC_OR_DEPARTMENT_OTHER): Payer: Self-pay

## 2024-03-22 ENCOUNTER — Other Ambulatory Visit (HOSPITAL_BASED_OUTPATIENT_CLINIC_OR_DEPARTMENT_OTHER): Payer: Self-pay

## 2024-03-22 MED ORDER — WEGOVY 0.5 MG/0.5ML ~~LOC~~ SOAJ
0.5000 mg | SUBCUTANEOUS | 1 refills | Status: AC
  Filled 2024-03-22: qty 2, 28d supply, fill #0

## 2024-03-26 ENCOUNTER — Encounter: Payer: Self-pay | Admitting: Internal Medicine

## 2024-04-01 ENCOUNTER — Encounter: Payer: Self-pay | Admitting: Internal Medicine

## 2024-04-02 ENCOUNTER — Other Ambulatory Visit: Payer: Self-pay

## 2024-04-02 MED ORDER — ROSUVASTATIN CALCIUM 10 MG PO TABS: 10.0000 mg | ORAL_TABLET | Freq: Every day | ORAL | 0 refills | Status: DC

## 2024-04-02 MED ORDER — LISINOPRIL-HYDROCHLOROTHIAZIDE 20-12.5 MG PO TABS: 1.0000 | ORAL_TABLET | Freq: Every day | ORAL | 0 refills | Status: DC

## 2024-04-04 ENCOUNTER — Other Ambulatory Visit: Payer: Self-pay

## 2024-04-04 MED ORDER — LISINOPRIL-HYDROCHLOROTHIAZIDE 20-12.5 MG PO TABS: 1.0000 | ORAL_TABLET | Freq: Every day | ORAL | 0 refills | Status: AC

## 2024-04-04 MED ORDER — ROSUVASTATIN CALCIUM 10 MG PO TABS: 10.0000 mg | ORAL_TABLET | Freq: Every day | ORAL | 0 refills | Status: AC
